# Patient Record
Sex: Female | Born: 1955
Health system: Southern US, Community
[De-identification: ages and names within clinical notes are randomized; demographics above are authoritative.]

## PROBLEM LIST (undated history)

## (undated) DIAGNOSIS — N200 Calculus of kidney: Secondary | ICD-10-CM

## (undated) DIAGNOSIS — I959 Hypotension, unspecified: Secondary | ICD-10-CM

## (undated) DIAGNOSIS — M5030 Other cervical disc degeneration, unspecified cervical region: Secondary | ICD-10-CM

## (undated) DIAGNOSIS — Z9889 Other specified postprocedural states: Secondary | ICD-10-CM

## (undated) DIAGNOSIS — Z87442 Personal history of urinary calculi: Secondary | ICD-10-CM

## (undated) DIAGNOSIS — Z803 Family history of malignant neoplasm of breast: Secondary | ICD-10-CM

## (undated) HISTORY — DX: Hypotension, unspecified: I95.9

## (undated) HISTORY — PX: CERVIX SURGERY: SHX593

## (undated) HISTORY — PX: FOOT SURGERY: SHX648

## (undated) HISTORY — DX: Other specified postprocedural states: Z98.890

## (undated) HISTORY — PX: OTHER SURGICAL HISTORY: SHX169

## (undated) HISTORY — DX: Family history of malignant neoplasm of breast: Z80.3

---

## 2006-02-01 ENCOUNTER — Ambulatory Visit: Payer: Self-pay | Admitting: Gastroenterology

## 2007-04-15 ENCOUNTER — Ambulatory Visit: Payer: Self-pay | Admitting: Sports Medicine

## 2009-12-21 ENCOUNTER — Ambulatory Visit: Payer: Self-pay | Admitting: Family Medicine

## 2011-02-21 HISTORY — PX: COLONOSCOPY: SHX174

## 2012-12-17 ENCOUNTER — Encounter: Payer: Self-pay | Admitting: Podiatry

## 2012-12-17 ENCOUNTER — Ambulatory Visit (INDEPENDENT_AMBULATORY_CARE_PROVIDER_SITE_OTHER): Payer: BC Managed Care – PPO

## 2012-12-17 ENCOUNTER — Ambulatory Visit (INDEPENDENT_AMBULATORY_CARE_PROVIDER_SITE_OTHER): Payer: BC Managed Care – PPO | Admitting: Podiatry

## 2012-12-17 VITALS — BP 128/66 | HR 77 | Resp 16 | Ht 65.0 in | Wt 150.0 lb

## 2012-12-17 DIAGNOSIS — R52 Pain, unspecified: Secondary | ICD-10-CM

## 2012-12-17 DIAGNOSIS — S92919A Unspecified fracture of unspecified toe(s), initial encounter for closed fracture: Secondary | ICD-10-CM

## 2012-12-17 NOTE — Progress Notes (Signed)
Subjective:     Patient ID: Olivia Richards, female   DOB: Jun 05, 1955, 57 y.o.   MRN: [id number]  Foot Pain   patient states that she hit her third toe of her right foot 2 weeks ago and is remaining very tender. She is concerned that she may have broke her toe   Review of Systems  All other systems reviewed and are negative.       Objective:   Physical Exam  Nursing note and vitals reviewed. Constitutional: She is oriented to person, place, and time. She appears well-nourished.  Cardiovascular: Intact distal pulses.   Musculoskeletal: Normal range of motion.  Neurological: She is oriented to person, place, and time.  Skin: Skin is warm.   patient has edema and pain in the third toe right foot with no proximal discomfort noted    Assessment:     Probable fracture third toe right foot    Plan:     H&P and x-ray review. Advised on open toed shoes with gradual return to shoes over the next 2 weeks and total recovery. We'll probably take 8-12 weeks reappoint as needed

## 2013-03-18 ENCOUNTER — Other Ambulatory Visit: Payer: Self-pay | Admitting: Podiatry

## 2014-06-18 ENCOUNTER — Encounter: Payer: Self-pay | Admitting: Podiatry

## 2014-06-18 ENCOUNTER — Ambulatory Visit (INDEPENDENT_AMBULATORY_CARE_PROVIDER_SITE_OTHER): Payer: Self-pay

## 2014-06-18 ENCOUNTER — Ambulatory Visit (INDEPENDENT_AMBULATORY_CARE_PROVIDER_SITE_OTHER): Payer: BC Managed Care – PPO | Admitting: Podiatry

## 2014-06-18 VITALS — Ht 65.5 in | Wt 150.0 lb

## 2014-06-18 DIAGNOSIS — M79671 Pain in right foot: Secondary | ICD-10-CM

## 2014-06-18 DIAGNOSIS — L03115 Cellulitis of right lower limb: Secondary | ICD-10-CM

## 2014-06-18 MED ORDER — DOXYCYCLINE HYCLATE 100 MG PO TABS: 100.0000 mg | ORAL_TABLET | Freq: Two times a day (BID) | ORAL | Status: DC

## 2014-06-22 NOTE — Progress Notes (Signed)
Patient ID: Olivia Richards, female   DOB: 1955-10-08, 59 y.o.   MRN: [id number]  Subjective: 59 year old female presents the office today with complaints of right foot pain. She states that she has swelling and redness of the inside aspect of her arch on the right foot. She states his been ongoing since Thursday. She states that she has pain to the area particularly with weightbearing and any pressure. She's been soaking in Epson salts. She does that she has a history of an abscess to this same foot. She denies stepping on any foreign objects denies any open sores. Denies any systemic complaints such as fevers, chills, nausea, vomiting. No acute changes since last appointment, and no other complaints at this time.   Objective: AAO x3, NAD DP/PT pulses palpable bilaterally, CRT less than 3 seconds Protective sensation intact with Simms Weinstein monofilament, vibratory sensation intact, Achilles tendon reflex intact Along the medial aspect of the arch of the right foot on the midfoot there is a localized area of edema and erythema. Upon palpation of this area there is tenderness. He does appear to be almost a cystlike structure deep to this area however denies small. There is no area of fluctuance or crepitus. No ascending cellulitis. No overlying ulceration. No areas of pinpoint bony tenderness or pain with vibratory sensation. MMT 5/5, ROM WNL. No edema, erythema, increase in warmth to bilateral lower extremities.  No open lesions or pre-ulcerative lesions.  No pain with calf compression, swelling, warmth, erythema  Assessment: 59 year old female with possible early infection versus cyst  Plan: -X-rays were obtained and reviewed the patient -All treatment options discussed with the patient including all alternatives, risks, complications.  -For now we'll treat for infection. Prescribed doxycycline. Recommend warm compresses. Monitor for any signs or symptoms of worsening infection and  directed to call the office and musician any occur or go to the ER. -Follow-up in 10 days. -Patient encouraged to call the office with any questions, concerns, change in symptoms.

## 2014-06-23 ENCOUNTER — Ambulatory Visit: Payer: Self-pay | Admitting: Podiatry

## 2014-06-25 ENCOUNTER — Ambulatory Visit: Payer: BC Managed Care – PPO | Admitting: Podiatry

## 2015-02-19 ENCOUNTER — Ambulatory Visit
Admission: RE | Admit: 2015-02-19 | Discharge: 2015-02-19 | Disposition: A | Discharge status: Home / Self Care | Payer: BC Managed Care – PPO | Source: Ambulatory Visit | Attending: Family Medicine | Admitting: Family Medicine

## 2015-02-19 ENCOUNTER — Ambulatory Visit (INDEPENDENT_AMBULATORY_CARE_PROVIDER_SITE_OTHER): Payer: BC Managed Care – PPO | Admitting: Family Medicine

## 2015-02-19 ENCOUNTER — Encounter: Payer: Self-pay | Admitting: Family Medicine

## 2015-02-19 VITALS — BP 140/80 | HR 80 | Ht 65.0 in | Wt 160.0 lb

## 2015-02-19 DIAGNOSIS — R200 Anesthesia of skin: Secondary | ICD-10-CM | POA: Diagnosis not present

## 2015-02-19 DIAGNOSIS — M5090 Cervical disc disorder, unspecified, unspecified cervical region: Secondary | ICD-10-CM

## 2015-02-19 DIAGNOSIS — R202 Paresthesia of skin: Secondary | ICD-10-CM | POA: Insufficient documentation

## 2015-02-19 NOTE — Progress Notes (Signed)
Name: Olivia Richards   MRN: [id number]    DOB: [birth year]   Date:02/19/2015       Progress Note  Subjective  Chief Complaint  Chief Complaint  Patient presents with  . Neck Pain    has deg disc disease- is having muscle spasms in back of neck. fingers and leg is going numb on L) side    Neck Pain  This is a chronic problem. The current episode started more than 1 year ago. The problem occurs daily. The problem has been waxing and waning. The pain is associated with nothing (hx of dance). The pain is present in the left side. The quality of the pain is described as aching. The pain is at a severity of 3/10. The pain is moderate. The symptoms are aggravated by position and stress. The pain is worse during the day. Associated symptoms include tingling. Pertinent negatives include no chest pain, fever, headaches, leg pain, numbness, paresis, visual change, weakness or weight loss. She has tried home exercises for the symptoms. The treatment provided no relief (presently).    No problem-specific assessment & plan notes found for this encounter.   Past Medical History  Diagnosis Date  . Low blood pressure     Past Surgical History  Procedure Laterality Date  . Foot surgery Left   . Cervix surgery  POLYP  . Colonoscopy  2013    Dr Gustavo Lah- every 5 yrs    No family history on file.  Social History   Social History  . Marital Status: Divorced    Spouse Name: N/A  . Number of Children: N/A  . Years of Education: N/A   Occupational History  . Not on file.   Social History Main Topics  . Smoking status: Never Smoker   . Smokeless tobacco: Never Used  . Alcohol Use: No  . Drug Use: No  . Sexual Activity: Not on file   Other Topics Concern  . Not on file   Social History Narrative    No Known Allergies   Review of Systems  Constitutional: Negative for fever, chills, weight loss and malaise/fatigue.  HENT: Negative for ear discharge, ear pain and sore throat.    Eyes: Negative for blurred vision.  Respiratory: Negative for cough, sputum production, shortness of breath and wheezing.   Cardiovascular: Negative for chest pain, palpitations and leg swelling.  Gastrointestinal: Negative for heartburn, nausea, abdominal pain, diarrhea, constipation, blood in stool and melena.  Genitourinary: Negative for dysuria, urgency, frequency and hematuria.  Musculoskeletal: Positive for neck pain. Negative for myalgias, back pain, joint pain and falls.  Skin: Negative for rash.  Neurological: Positive for tingling. Negative for dizziness, sensory change, focal weakness, weakness, numbness and headaches.  Endo/Heme/Allergies: Negative for environmental allergies and polydipsia. Does not bruise/bleed easily.  Psychiatric/Behavioral: Negative for depression and suicidal ideas. The patient is not nervous/anxious and does not have insomnia.      Objective  Filed Vitals:   02/19/15 1417  BP: 140/80  Pulse: 80  Height: 5\' 5"  (1.651 m)  Weight: 160 lb (72.576 kg)    Physical Exam  Constitutional: She is oriented to person, place, and time and well-developed, well-nourished, and in no distress. No distress.  HENT:  Head: Normocephalic and atraumatic.  Right Ear: External ear normal.  Left Ear: External ear normal.  Nose: Nose normal.  Mouth/Throat: Oropharynx is clear and moist.  Eyes: Conjunctivae and EOM are normal. Pupils are equal, round, and reactive to light. Right eye exhibits no  discharge. Left eye exhibits no discharge.  Neck: Normal range of motion. Neck supple. No JVD present. No thyromegaly present.  Cardiovascular: Normal rate, regular rhythm, normal heart sounds and intact distal pulses.  Exam reveals no gallop and no friction rub.   No murmur heard. Pulmonary/Chest: Effort normal and breath sounds normal.  Abdominal: Soft. Bowel sounds are normal. She exhibits no mass. There is no tenderness. There is no guarding.  Musculoskeletal: Normal range  of motion. She exhibits no edema.  Lymphadenopathy:    She has no cervical adenopathy.  Neurological: She is alert and oriented to person, place, and time. She has normal motor skills, normal sensation, normal strength, normal reflexes and intact cranial nerves.  Skin: Skin is warm and dry. She is not diaphoretic.  Psychiatric: Mood and affect normal.      Assessment & Plan  Problem List Items Addressed This Visit    None    Visit Diagnoses    Cervical disc disease    -  Primary    Relevant Orders    DG Cervical Spine Complete         Dr. Otilio Miu [facility]  02/19/2015

## 2015-02-19 NOTE — Patient Instructions (Signed)
Degenerative Disk Disease  Degenerative disk disease is a condition caused by the changes that occur in spinal disks as you grow older. Spinal disks are soft and compressible disks located between the bones of your spine (vertebrae). These disks act like shock absorbers. Degenerative disk disease can affect the whole spine. However, the neck and lower back are most commonly affected. Many changes can occur in the spinal disks with aging, such as:  · The spinal disks may dry and shrink.  · Small tears may occur in the tough, outer covering of the disk (annulus).  · The disk space may become smaller due to loss of water.  · Abnormal growths in the bone (spurs) may occur. This can put pressure on the nerve roots exiting the spinal canal, causing pain.  · The spinal canal may become narrowed.  RISK FACTORS   · Being overweight.  · Having a family history of degenerative disk disease.  · Smoking.  · There is increased risk if you are doing heavy lifting or have a sudden injury.  SIGNS AND SYMPTOMS   Symptoms vary from person to person and may include:  · Pain that varies in intensity. Some people have no pain, while others have severe pain. The location of the pain depends on the part of your backbone that is affected.  ¨ You will have neck or arm pain if a disk in the neck area is affected.  ¨ You will have pain in your back, buttocks, or legs if a disk in the lower back is affected.  · Pain that becomes worse while bending, reaching up, or with twisting movements.  · Pain that may start gradually and then get worse as time passes. It may also start after a major or minor injury.  · Numbness or tingling in the arms or legs.  DIAGNOSIS   Your health care provider will ask you about your symptoms and about activities or habits that may cause the pain. He or she may also ask about any injuries, diseases, or treatments you have had. Your health care provider will examine you to check for the range of movement that is  possible in the affected area, to check for strength in your extremities, and to check for sensation in the areas of the arms and legs supplied by different nerve roots. You may also have:   · An X-ray of the spine.  · Other imaging tests, such as MRI.  TREATMENT   Your health care provider will advise you on the best plan for treatment. Treatment may include:  · Medicines.  · Rehabilitation exercises.  HOME CARE INSTRUCTIONS   · Follow proper lifting and walking techniques as advised by your health care provider.  · Maintain good posture.  · Exercise regularly as advised by your health care provider.  · Perform relaxation exercises.  · Change your sitting, standing, and sleeping habits as advised by your health care provider.  · Change positions frequently.  · Lose weight or maintain a healthy weight as advised by your health care provider.  · Do not use any tobacco products, including cigarettes, chewing tobacco, or electronic cigarettes. If you need help quitting, ask your health care provider.  · Wear supportive footwear.  · Take medicines only as directed by your health care provider.  SEEK MEDICAL CARE IF:   · Your pain does not go away within 1-4 weeks.  · You have significant appetite or weight loss.  SEEK IMMEDIATE MEDICAL CARE IF:   ·   Your pain is severe.  · You notice weakness in your arms, hands, or legs.  · You begin to lose control of your bladder or bowel movements.  · You have fevers or night sweats.  MAKE SURE YOU:   · Understand these instructions.  · Will watch your condition.  · Will get help right away if you are not doing well or get worse.     This information is not intended to replace advice given to you by your health care provider. Make sure you discuss any questions you have with your health care provider.     Document Released: 12/04/2006 Document Revised: 02/27/2014 Document Reviewed: 06/10/2013  Elsevier Interactive Patient Education ©2016 [employer]

## 2015-02-21 DIAGNOSIS — Z9889 Other specified postprocedural states: Secondary | ICD-10-CM

## 2015-02-21 HISTORY — DX: Other specified postprocedural states: Z98.890

## 2015-02-23 ENCOUNTER — Other Ambulatory Visit: Payer: Self-pay

## 2015-02-23 DIAGNOSIS — M539 Dorsopathy, unspecified: Secondary | ICD-10-CM

## 2015-06-10 ENCOUNTER — Encounter: Payer: Self-pay | Admitting: Family Medicine

## 2015-06-10 ENCOUNTER — Ambulatory Visit (INDEPENDENT_AMBULATORY_CARE_PROVIDER_SITE_OTHER): Payer: BC Managed Care – PPO | Admitting: Family Medicine

## 2015-06-10 VITALS — BP 120/78 | HR 72 | Ht 65.0 in | Wt 172.0 lb

## 2015-06-10 DIAGNOSIS — N3090 Cystitis, unspecified without hematuria: Secondary | ICD-10-CM

## 2015-06-10 DIAGNOSIS — M533 Sacrococcygeal disorders, not elsewhere classified: Secondary | ICD-10-CM | POA: Diagnosis not present

## 2015-06-10 LAB — POCT URINALYSIS DIPSTICK
Bilirubin, UA: NEGATIVE
Blood, UA: NEGATIVE
Glucose, UA: NEGATIVE
KETONES UA: NEGATIVE
Leukocytes, UA: NEGATIVE
Nitrite, UA: NEGATIVE
PROTEIN UA: NEGATIVE
Spec Grav, UA: 1.02
Urobilinogen, UA: 0.2
pH, UA: 5

## 2015-06-10 MED ORDER — FLUCONAZOLE 150 MG PO TABS: 150.0000 mg | ORAL_TABLET | Freq: Once | ORAL | Status: DC

## 2015-06-10 MED ORDER — NITROFURANTOIN MONOHYD MACRO 100 MG PO CAPS: 100.0000 mg | ORAL_CAPSULE | Freq: Two times a day (BID) | ORAL | Status: DC

## 2015-06-10 NOTE — Progress Notes (Signed)
Name: Olivia Richards   MRN: [id number]    DOB: 1955/05/21   Date:06/10/2015       Progress Note  Subjective  Chief Complaint  Chief Complaint  Patient presents with  . Flank Pain    "hurting in Left kidney"- started Monday- years ago was kept on Macrobid due to wearing tights and sweating alot    Flank Pain This is a new problem. The current episode started in the past 7 days. The problem occurs daily. The problem has been waxing and waning since onset. The pain is present in the lumbar spine. The quality of the pain is described as aching. The pain does not radiate. The pain is mild. The pain is worse during the day. Pertinent negatives include no abdominal pain, bladder incontinence, bowel incontinence, chest pain, dysuria, fever, headaches, paresthesias, tingling or weight loss. The treatment provided no relief.    No problem-specific assessment & plan notes found for this encounter.   Past Medical History  Diagnosis Date  . Low blood pressure     Past Surgical History  Procedure Laterality Date  . Foot surgery Left   . Cervix surgery  POLYP  . Colonoscopy  2013    Dr Gustavo Lah- every 5 yrs  . Lithotripsy      History reviewed. No pertinent family history.  Social History   Social History  . Marital Status: Divorced    Spouse Name: N/A  . Number of Children: N/A  . Years of Education: N/A   Occupational History  . Not on file.   Social History Main Topics  . Smoking status: Never Smoker   . Smokeless tobacco: Never Used  . Alcohol Use: No  . Drug Use: No  . Sexual Activity: Not Currently   Other Topics Concern  . Not on file   Social History Narrative    No Known Allergies   Review of Systems  Constitutional: Negative for fever, chills, weight loss and malaise/fatigue.  HENT: Negative for ear discharge, ear pain and sore throat.   Eyes: Negative for blurred vision.  Respiratory: Negative for cough, sputum production, shortness of breath and  wheezing.   Cardiovascular: Negative for chest pain, palpitations and leg swelling.  Gastrointestinal: Negative for heartburn, nausea, abdominal pain, diarrhea, constipation, blood in stool, melena and bowel incontinence.  Genitourinary: Positive for flank pain. Negative for bladder incontinence, dysuria, urgency, frequency and hematuria.  Musculoskeletal: Negative for myalgias, back pain, joint pain and neck pain.  Skin: Negative for rash.  Neurological: Negative for dizziness, tingling, sensory change, focal weakness, headaches and paresthesias.  Endo/Heme/Allergies: Negative for environmental allergies and polydipsia. Does not bruise/bleed easily.  Psychiatric/Behavioral: Negative for depression and suicidal ideas. The patient is not nervous/anxious and does not have insomnia.      Objective  Filed Vitals:   06/10/15 0810  BP: 120/78  Pulse: 72  Height: 5\' 5"  (1.651 m)  Weight: 172 lb (78.019 kg)    Physical Exam  Constitutional: She is well-developed, well-nourished, and in no distress. No distress.  HENT:  Head: Normocephalic and atraumatic.  Right Ear: External ear normal.  Left Ear: External ear normal.  Nose: Nose normal.  Mouth/Throat: Oropharynx is clear and moist.  Eyes: Conjunctivae and EOM are normal. Pupils are equal, round, and reactive to light. Right eye exhibits no discharge. Left eye exhibits no discharge.  Neck: Normal range of motion. Neck supple. No JVD present. No thyromegaly present.  Cardiovascular: Normal rate, regular rhythm, normal heart sounds and intact distal  pulses.  Exam reveals no gallop and no friction rub.   No murmur heard. Pulmonary/Chest: Effort normal and breath sounds normal.  Abdominal: Soft. Bowel sounds are normal. She exhibits no mass. There is no tenderness. There is no guarding.  Musculoskeletal: Normal range of motion. She exhibits no edema.  Lymphadenopathy:    She has no cervical adenopathy.  Neurological: She is alert. She has  normal motor skills, normal sensation, normal strength and normal reflexes.  Skin: Skin is warm and dry. She is not diaphoretic.  Psychiatric: Mood and affect normal.  Nursing note and vitals reviewed.     Assessment & Plan  Problem List Items Addressed This Visit    None    Visit Diagnoses    Cystitis    -  Primary    Relevant Medications    nitrofurantoin, macrocrystal-monohydrate, (MACROBID) 100 MG capsule    fluconazole (DIFLUCAN) 150 MG tablet    Other Relevant Orders    POCT Urinalysis Dipstick (Completed)    Arthralgia, sacroiliac        resume NSAID         Dr. [facility]  06/10/2015

## 2015-12-21 ENCOUNTER — Encounter: Payer: Self-pay | Admitting: Family Medicine

## 2015-12-21 ENCOUNTER — Ambulatory Visit
Admission: RE | Admit: 2015-12-21 | Discharge: 2015-12-21 | Disposition: A | Discharge status: Home / Self Care | Payer: BC Managed Care – PPO | Source: Ambulatory Visit | Attending: Family Medicine | Admitting: Family Medicine

## 2015-12-21 ENCOUNTER — Ambulatory Visit (INDEPENDENT_AMBULATORY_CARE_PROVIDER_SITE_OTHER): Payer: BC Managed Care – PPO | Admitting: Family Medicine

## 2015-12-21 ENCOUNTER — Other Ambulatory Visit
Admission: RE | Admit: 2015-12-21 | Discharge: 2015-12-21 | Disposition: A | Discharge status: Home / Self Care | Payer: BC Managed Care – PPO | Source: Ambulatory Visit | Attending: Family Medicine | Admitting: Family Medicine

## 2015-12-21 VITALS — BP 118/70 | HR 78 | Temp 100.0°F | Ht 65.0 in | Wt 171.0 lb

## 2015-12-21 DIAGNOSIS — R509 Fever, unspecified: Secondary | ICD-10-CM

## 2015-12-21 DIAGNOSIS — J189 Pneumonia, unspecified organism: Secondary | ICD-10-CM | POA: Diagnosis not present

## 2015-12-21 LAB — CBC
HCT: 41.1 % (ref 35.0–47.0)
HEMOGLOBIN: 13.7 g/dL (ref 12.0–16.0)
MCH: 26.7 pg (ref 26.0–34.0)
MCHC: 33.2 g/dL (ref 32.0–36.0)
MCV: 80.5 fL (ref 80.0–100.0)
Platelets: 219 10*3/uL (ref 150–440)
RBC: 5.11 MIL/uL (ref 3.80–5.20)
RDW: 14 % (ref 11.5–14.5)
WBC: 9 10*3/uL (ref 3.6–11.0)

## 2015-12-21 LAB — POCT INFLUENZA A/B
Influenza A, POC: NEGATIVE
Influenza B, POC: NEGATIVE

## 2015-12-21 MED ORDER — LEVOFLOXACIN 500 MG PO TABS: 500.0000 mg | ORAL_TABLET | Freq: Every day | ORAL | 0 refills | Status: DC

## 2015-12-21 MED ORDER — GUAIFENESIN-CODEINE 100-10 MG/5ML PO SYRP: 5.0000 mL | ORAL_SOLUTION | Freq: Three times a day (TID) | ORAL | 0 refills | Status: DC | PRN

## 2015-12-21 NOTE — Progress Notes (Signed)
Name: Olivia Richards   MRN: [id number]    DOB: 13-Sep-1955   Date:12/21/2015       Progress Note  Subjective  Chief Complaint  Chief Complaint  Patient presents with  . Generalized Body Aches    "want to rule out the flu".     Fever   This is a new problem. The current episode started yesterday. The problem occurs intermittently. The problem has been waxing and waning. The maximum temperature noted was 100 to 100.9 F. The temperature was taken using an oral thermometer. Associated symptoms include coughing, headaches and muscle aches. Pertinent negatives include no abdominal pain, chest pain, congestion, diarrhea, ear pain, nausea, rash, sleepiness, sore throat, urinary pain, vomiting or wheezing. She has tried acetaminophen for the symptoms. The treatment provided no relief.  Risk factors: sick contacts   Risk factors: no recent sickness and no recent travel   Cough  This is a new problem. The current episode started in the past 7 days. The problem has been gradually worsening. The cough is non-productive. Associated symptoms include chills, a fever, headaches, myalgias and rhinorrhea. Pertinent negatives include no chest pain, ear congestion, ear pain, heartburn, hemoptysis, nasal congestion, postnasal drip, rash, sore throat, shortness of breath, sweats, weight loss or wheezing. She has tried nothing for the symptoms. There is no history of environmental allergies.    No problem-specific Assessment & Plan notes found for this encounter.   Past Medical History:  Diagnosis Date  . Low blood pressure     Past Surgical History:  Procedure Laterality Date  . CERVIX SURGERY  POLYP  . COLONOSCOPY  2013   Dr Gustavo Lah- every 5 yrs  . FOOT SURGERY Left   . lithotripsy      History reviewed. No pertinent family history.  Social History   Social History  . Marital status: Divorced    Spouse name: N/A  . Number of children: N/A  . Years of education: N/A   Occupational  History  . Not on file.   Social History Main Topics  . Smoking status: Never Smoker  . Smokeless tobacco: Never Used  . Alcohol use No  . Drug use: No  . Sexual activity: Not Currently   Other Topics Concern  . Not on file   Social History Narrative  . No narrative on file    No Known Allergies   Review of Systems  Constitutional: Positive for chills and fever. Negative for malaise/fatigue and weight loss.  HENT: Positive for rhinorrhea. Negative for congestion, ear discharge, ear pain, postnasal drip and sore throat.   Eyes: Negative for blurred vision.  Respiratory: Positive for cough. Negative for hemoptysis, sputum production, shortness of breath and wheezing.   Cardiovascular: Negative for chest pain, palpitations and leg swelling.  Gastrointestinal: Negative for abdominal pain, blood in stool, constipation, diarrhea, heartburn, melena, nausea and vomiting.  Genitourinary: Negative for dysuria, frequency, hematuria and urgency.  Musculoskeletal: Positive for myalgias. Negative for back pain, joint pain and neck pain.  Skin: Negative for rash.  Neurological: Positive for headaches. Negative for dizziness, tingling, sensory change and focal weakness.  Endo/Heme/Allergies: Negative for environmental allergies and polydipsia. Does not bruise/bleed easily.  Psychiatric/Behavioral: Negative for depression and suicidal ideas. The patient is not nervous/anxious and does not have insomnia.      Objective  Vitals:   12/21/15 1539  BP: 118/70  Pulse: 78  Temp: 100 F (37.8 C)  SpO2: 98%  Weight: 171 lb (77.6 kg)  Height: 5\' 5"  (  1.651 m)    Physical Exam  Constitutional: She is well-developed, well-nourished, and in no distress. No distress.  HENT:  Head: Normocephalic and atraumatic.  Right Ear: External ear normal.  Left Ear: External ear normal.  Nose: Nose normal.  Mouth/Throat: Oropharynx is clear and moist.  Eyes: Conjunctivae and EOM are normal. Pupils are  equal, round, and reactive to light. Right eye exhibits no discharge. Left eye exhibits no discharge.  Neck: Normal range of motion. Neck supple. No JVD present. No thyromegaly present.  Cardiovascular: Normal rate, regular rhythm, normal heart sounds and intact distal pulses.  Exam reveals no gallop and no friction rub.   No murmur heard. Pulmonary/Chest: Effort normal and breath sounds normal. She has no wheezes. She has no rales.  Abdominal: Soft. Bowel sounds are normal. She exhibits no mass. There is no tenderness. There is no guarding.  Musculoskeletal: Normal range of motion. She exhibits no edema.  Lymphadenopathy:    She has no cervical adenopathy.  Neurological: She is alert. She has normal reflexes.  Skin: Skin is warm and dry. She is not diaphoretic.  Psychiatric: Mood and affect normal.  Nursing note and vitals reviewed.     Assessment & Plan  Problem List Items Addressed This Visit    None    Visit Diagnoses    Pneumonia due to infectious organism, unspecified laterality, unspecified part of lung    -  Primary   cbc   Relevant Medications   levofloxacin (LEVAQUIN) 500 MG tablet   guaiFENesin-codeine (ROBITUSSIN AC) 100-10 MG/5ML syrup   Other Relevant Orders   DG Chest 2 View   POCT Influenza A/B (Completed)   Fever, unspecified fever cause       Relevant Orders   POCT Influenza A/B (Completed)        Dr. [facility]  12/21/15

## 2015-12-24 ENCOUNTER — Other Ambulatory Visit: Payer: Self-pay

## 2015-12-24 DIAGNOSIS — J189 Pneumonia, unspecified organism: Secondary | ICD-10-CM

## 2015-12-24 MED ORDER — LEVOFLOXACIN 500 MG PO TABS: 500.0000 mg | ORAL_TABLET | Freq: Every day | ORAL | 0 refills | Status: DC

## 2016-01-17 ENCOUNTER — Other Ambulatory Visit: Payer: Self-pay

## 2017-02-04 ENCOUNTER — Other Ambulatory Visit: Payer: Self-pay

## 2017-02-04 ENCOUNTER — Ambulatory Visit
Admission: EM | Admit: 2017-02-04 | Discharge: 2017-02-04 | Disposition: A | Discharge status: Home / Self Care | Payer: BC Managed Care – PPO | Attending: Family Medicine | Admitting: Family Medicine

## 2017-02-04 DIAGNOSIS — K219 Gastro-esophageal reflux disease without esophagitis: Secondary | ICD-10-CM | POA: Diagnosis not present

## 2017-02-04 DIAGNOSIS — R0789 Other chest pain: Secondary | ICD-10-CM | POA: Diagnosis not present

## 2017-02-04 DIAGNOSIS — M5010 Cervical disc disorder with radiculopathy, unspecified cervical region: Secondary | ICD-10-CM | POA: Diagnosis not present

## 2017-02-04 DIAGNOSIS — M4722 Other spondylosis with radiculopathy, cervical region: Secondary | ICD-10-CM | POA: Insufficient documentation

## 2017-02-04 DIAGNOSIS — Z87442 Personal history of urinary calculi: Secondary | ICD-10-CM | POA: Insufficient documentation

## 2017-02-04 DIAGNOSIS — R202 Paresthesia of skin: Secondary | ICD-10-CM | POA: Insufficient documentation

## 2017-02-04 DIAGNOSIS — Z79899 Other long term (current) drug therapy: Secondary | ICD-10-CM | POA: Diagnosis not present

## 2017-02-04 HISTORY — DX: Other cervical disc degeneration, unspecified cervical region: M50.30

## 2017-02-04 HISTORY — DX: Calculus of kidney: N20.0

## 2017-02-04 NOTE — ED Triage Notes (Signed)
Pt with 2 days of left arm numbess, left left numbness intermittently. Thoracic spine sharp pain and left scapular pain. Chest tightness and has "lots of belching".

## 2017-02-04 NOTE — ED Provider Notes (Signed)
[facility]    CSN: [id number] Arrival date & time: 02/04/17  1322     History   Chief Complaint Chief Complaint  Patient presents with  . Arm Problem    HPI Olivia Richards is a 61 y.o. female.   61 yo female with a c/o 2 days of intermittent left arm numbness and pain as well as pain to left upper back and shoulder blade area. Patient denies any falls or other traumatic injury. Patient has a h/o of similar symptoms in the past and had cervical spine x-rays 2 years ago that showed multilevel degenerative disc disease and bone spurs of the cervical spine. Denies any weakness, vision changes, headaches, speech or swallowing problems.   Patient also complains of vague lower chest/epigastric tightness associated with belching, acid reflux and indigestion. Denies any chest pressure or pain. States she has been taking Aleve recently for her neck and back pains.    The history is provided by the patient.    Past Medical History:  Diagnosis Date  . DDD (degenerative disc disease), cervical   . Kidney stones   . Low blood pressure     There are no active problems to display for this patient.   Past Surgical History:  Procedure Laterality Date  . CERVIX SURGERY  POLYP  . COLONOSCOPY  2013   Dr Gustavo Lah- every 5 yrs  . FOOT SURGERY Left   . lithotripsy      OB History    No data available       Home Medications    Prior to Admission medications   Medication Sig Start Date End Date Taking? Authorizing Provider  cholecalciferol (VITAMIN D) 1000 units tablet Take 1,000 Units by mouth daily.   Yes [provider]  ammonium lactate (LAC-HYDRIN) 12 % lotion APPLY TO SKIN DAILY Patient not taking: Reported on 12/21/2015 03/18/13   Wallene Huh, DPM  fluconazole (DIFLUCAN) 150 MG tablet Take 1 tablet (150 mg total) by mouth once. Patient not taking: Reported on 12/21/2015 06/10/15   Juline Patch, MD  guaiFENesin-codeine [facility]) 100-10  MG/5ML syrup Take 5 mLs by mouth 3 (three) times daily as needed for cough. 12/21/15   Juline Patch, MD  levofloxacin (LEVAQUIN) 500 MG tablet Take 1 tablet (500 mg total) by mouth daily. 12/24/15   Juline Patch, MD  nitrofurantoin, macrocrystal-monohydrate, (MACROBID) 100 MG capsule Take 1 capsule (100 mg total) by mouth 2 (two) times daily. Patient not taking: Reported on 12/21/2015 06/10/15   Juline Patch, MD    Family History Family History  Problem Relation Age of Onset  . Cancer Mother   . Heart disease Father     Social History Social History   Tobacco Use  . Smoking status: Never Smoker  . Smokeless tobacco: Never Used  Substance Use Topics  . Alcohol use: No  . Drug use: No     Allergies   Levaquin [levofloxacin in d5w]   Review of Systems Review of Systems   Physical Exam Triage Vital Signs ED Triage Vitals  Enc Vitals Group     BP 02/04/17 1339 (!) 158/77     Pulse Rate 02/04/17 1339 79     Resp 02/04/17 1339 16     Temp 02/04/17 1339 97.8 F (36.6 C)     Temp Source 02/04/17 1339 Oral     SpO2 02/04/17 1339 99 %     Weight 02/04/17 1339 165 lb (74.8 kg)  Height 02/04/17 1339 5' 5.5" (1.664 m)     Head Circumference --      Peak Flow --      Pain Score 02/04/17 1340 3     Pain Loc --      Pain Edu? --      Excl. in [place]? --    No data found.  Updated Vital Signs BP (!) 158/77 (BP Location: Right Arm)   Pulse 79   Temp 97.8 F (36.6 C) (Oral)   Resp 16   Ht 5' 5.5" (1.664 m)   Wt 165 lb (74.8 kg)   SpO2 99%   BMI 27.04 kg/m   Visual Acuity Right Eye Distance:   Left Eye Distance:   Bilateral Distance:    Right Eye Near:   Left Eye Near:    Bilateral Near:     Physical Exam  Constitutional: She is oriented to person, place, and time. She appears well-developed and well-nourished. No distress.  HENT:  Head: Normocephalic and atraumatic.  Right Ear: Tympanic membrane, external ear and ear canal normal.  Left Ear: Tympanic  membrane, external ear and ear canal normal.  Mouth/Throat: Mucous membranes are normal.  Eyes: EOM are normal. Pupils are equal, round, and reactive to light.  Neck: Normal range of motion. Neck supple. No JVD present. No tracheal deviation present. No thyromegaly present.  Cardiovascular: Normal rate, regular rhythm, normal heart sounds and intact distal pulses.  No murmur heard. Pulmonary/Chest: Effort normal and breath sounds normal. No stridor. No respiratory distress. She has no wheezes. She has no rales. She exhibits no tenderness.  Abdominal: Soft. Bowel sounds are normal. She exhibits no distension and no mass. There is no tenderness. There is no rebound and no guarding.  Musculoskeletal: She exhibits no edema.       Cervical back: She exhibits tenderness (over the left trapezius ) and spasm.  Lymphadenopathy:    She has no cervical adenopathy.  Neurological: She is alert and oriented to person, place, and time. She displays normal reflexes. No cranial nerve deficit or sensory deficit. She exhibits normal muscle tone. Coordination normal.  Skin: Skin is warm and dry. No rash noted. She is not diaphoretic. No erythema. No pallor.  Vitals reviewed.    UC Treatments / Results  Labs (all labs ordered are listed, but only abnormal results are displayed) Labs Reviewed - No data to display  EKG  EKG Interpretation None       Radiology No results found.  Procedures ED EKG Date/Time: 02/04/2017 2:19 PM Performed by: Norval Gable, MD Authorized by: Norval Gable, MD   ECG reviewed by ED Physician in the absence of a cardiologist: yes   Previous ECG:    Previous ECG:  Unavailable Interpretation:    Interpretation: normal   Rate:    ECG rate assessment: normal   Rhythm:    Rhythm: sinus rhythm   Ectopy:    Ectopy: none   QRS:    QRS axis:  Normal Conduction:    Conduction: normal   ST segments:    ST segments:  Normal T waves:    T waves: normal      (including critical care time)  Medications Ordered in UC Medications - No data to display   Initial Impression / Assessment and Plan / UC Course  I have reviewed the triage vital signs and the nursing notes.  Pertinent labs & imaging results that were available during my care of the patient were reviewed by  me and considered in my medical decision making (see chart for details).       Final Clinical Impressions(s) / UC Diagnoses   Final diagnoses:  Gastroesophageal reflux disease, esophagitis presence not specified  Arm paresthesia, left  Osteoarthritis of spine with radiculopathy, cervical region    ED Discharge Orders    None     1. ekg results and diagnoses reviewed with patient 2. Recommend supportive treatment with otc prilosec or other PPI, stop NSAIDS for now, tylenol extra strength prn for pain, heat to area and stretching 3. Follow up with PCP for possible further testing or imaging 4. Follow-up prn if symptoms worsen or don't improve  Controlled Substance Prescriptions [place] Controlled Substance Registry consulted? Not Applicable   Norval Gable, MD 02/04/17 1426

## 2017-02-04 NOTE — Discharge Instructions (Signed)
Over the counter Prilosec once daily Tylenol extra strength as needed\ Heat, massage Follow up with PCP for further testing

## 2018-01-24 ENCOUNTER — Ambulatory Visit: Payer: Self-pay | Admitting: Certified Nurse Midwife

## 2018-01-25 ENCOUNTER — Other Ambulatory Visit (HOSPITAL_COMMUNITY)
Admission: RE | Admit: 2018-01-25 | Discharge: 2018-01-25 | Disposition: A | Discharge status: Home / Self Care | Payer: BC Managed Care – PPO | Source: Ambulatory Visit | Attending: Maternal Newborn | Admitting: Maternal Newborn

## 2018-01-25 ENCOUNTER — Encounter: Payer: Self-pay | Admitting: Maternal Newborn

## 2018-01-25 ENCOUNTER — Ambulatory Visit (INDEPENDENT_AMBULATORY_CARE_PROVIDER_SITE_OTHER): Payer: BC Managed Care – PPO | Admitting: Maternal Newborn

## 2018-01-25 VITALS — BP 148/84 | HR 66 | Ht 65.5 in | Wt 170.0 lb

## 2018-01-25 DIAGNOSIS — Z124 Encounter for screening for malignant neoplasm of cervix: Secondary | ICD-10-CM

## 2018-01-25 DIAGNOSIS — Z01419 Encounter for gynecological examination (general) (routine) without abnormal findings: Secondary | ICD-10-CM | POA: Diagnosis not present

## 2018-01-25 DIAGNOSIS — Z8739 Personal history of other diseases of the musculoskeletal system and connective tissue: Secondary | ICD-10-CM

## 2018-01-25 DIAGNOSIS — Z13220 Encounter for screening for lipoid disorders: Secondary | ICD-10-CM

## 2018-01-25 NOTE — Progress Notes (Addendum)
Gynecology Annual Exam  PCP: Juline Patch, MD  Chief Complaint:  Chief Complaint  Patient presents with  . Gynecologic Exam    NP    History of Present Illness:Patient is a 62 y.o. [id number] presenting for an annual exam. The patient has no complaints today.   LMP: No LMP recorded. Patient is postmenopausal.  The patient is not currently sexually active. The patient does perform self breast exams.  There is notable family history of breast cancer in her mother at age 78. There is no other history of breast or ovarian cancer in her family.   The patient wears seatbelts: yes.   The patient has regular exercise: yes.    The patient denies current symptoms of depression.     Review of Systems  Constitutional: Negative.   HENT: Negative.   Eyes: Negative.   Respiratory: Positive for cough. Negative for shortness of breath and wheezing.   Cardiovascular: Negative for chest pain and palpitations.  Gastrointestinal: Negative.   Genitourinary: Negative.   Musculoskeletal: Negative.   Skin: Negative.   Neurological: Negative.   Endo/Heme/Allergies: Positive for environmental allergies.  Psychiatric/Behavioral: Negative.   All other systems reviewed and are negative.   Past Medical History:  Past Medical History:  Diagnosis Date  . DDD (degenerative disc disease), cervical   . H/O colonoscopy 2017  . Kidney stones   . Low blood pressure     Past Surgical History:  Past Surgical History:  Procedure Laterality Date  . CERVIX SURGERY  POLYP  . COLONOSCOPY  2013   Dr Gustavo Lah- every 5 yrs  . FOOT SURGERY Left   . lithotripsy      Gynecologic History:  No LMP recorded. Patient is postmenopausal. Last Pap: 11/27/2014. Results were: NILM and HPV Negative Last mammogram: 12/13/2017.  Results were: BI-RAD II Obstetric History: [id number]  Family History:  Family History  Problem Relation Age of Onset  . Breast cancer Mother   . Heart disease Father     Social  History:  Social History   Socioeconomic History  . Marital status: Divorced    Spouse name: Not on file  . Number of children: Not on file  . Years of education: Not on file  . Highest education level: Not on file  Occupational History  . Not on file  Social Needs  . Financial resource strain: Not on file  . Food insecurity:    Worry: Not on file    Inability: Not on file  . Transportation needs:    Medical: Not on file    Non-medical: Not on file  Tobacco Use  . Smoking status: Never Smoker  . Smokeless tobacco: Never Used  Substance and Sexual Activity  . Alcohol use: No  . Drug use: No  . Sexual activity: Not Currently    Birth control/protection: Post-menopausal  Lifestyle  . Physical activity:    Days per week: Not on file    Minutes per session: Not on file  . Stress: Not on file  Relationships  . Social connections:    Talks on phone: Not on file    Gets together: Not on file    Attends religious service: Not on file    Active member of club or organization: Not on file    Attends meetings of clubs or organizations: Not on file    Relationship status: Not on file  . Intimate partner violence:    Fear of current or ex partner: Not on file  Emotionally abused: Not on file    Physically abused: Not on file    Forced sexual activity: Not on file  Other Topics Concern  . Not on file  Social History Narrative  . Not on file    Allergies:  Allergies  Allergen Reactions  . Onion Other (See Comments)    Diarrhea and vomiting  . Levaquin [Levofloxacin In D5w] Itching    Medications: Prior to Admission medications   Medication Sig Start Date End Date Taking? Authorizing Provider  ammonium lactate (LAC-HYDRIN) 12 % lotion APPLY TO SKIN DAILY 03/18/13  Yes Regal, Tamala Fothergill, DPM  cholecalciferol (VITAMIN D) 1000 units tablet Take 1,000 Units by mouth daily.   Yes [provider]    Physical Exam Vitals: Blood pressure (!) 150/82, pulse 66, height  5' 5.5" (1.664 m), weight 170 lb (77.1 kg).  General: NAD HEENT: normocephalic, anicteric Thyroid: no enlargement, no palpable nodules Pulmonary: No increased work of breathing, CTAB Cardiovascular: RRR, no murmurs, rubs, or gallops Breast: Breasts symmetrical, no tenderness, no palpable nodules or masses, no skin or nipple retraction present, no nipple discharge.  No axillary or supraclavicular lymphadenopathy. Abdomen: Soft, non-tender, non-distended.  Umbilicus without lesions.  No hepatomegaly, splenomegaly or masses palpable. No evidence of hernia  Genitourinary:  External: Normal external female genitalia.  Normal urethral  meatus, normal Bartholin's and Skene's glands.    Vagina: Normal vaginal mucosa, no evidence of prolapse.    Cervix: Grossly normal in appearance, no bleeding  Uterus: Non-enlarged, mobile, normal contour.  No CMT  Adnexa: ovaries non-enlarged, no adnexal masses  Rectal: deferred  Lymphatic: no evidence of inguinal lymphadenopathy Extremities: no edema, erythema, or tenderness Neurologic: Grossly intact Psychiatric: mood appropriate, affect full   Assessment: 62 y.o. G2P2002 annual exam.  Plan: Problem List Items Addressed This Visit    None    Visit Diagnoses    Women's annual routine gynecological examination    -  Primary   History of osteopenia       Relevant Orders   Vitamin D (25 hydroxy) (Completed)   Screening for cholesterol level       Relevant Orders   Lipid panel (Completed)   Pap smear for cervical cancer screening       Relevant Orders   Cytology - PAP      1) Mammogram - recommend yearly screening mammogram.  Mammogram is up to date.  2) STI screening was offered and declined.  3) ASCCP guidelines and rationale discussed.  Patient opts for yearly screening interval.  4) Osteoporosis  - per USPTF routine screening DEXA at age 87 Has had one prior scan showing osteopenia a few years ago. She is supplementing with calcium/Vitamin  D.  5) Routine healthcare maintenance including cholesterol, diabetes screening: lipid panel and Vitamin D ordered today.  6) Colonoscopy last done 09/2016, repeat in 5 years.  7) We discussed Myriad genetic screening for cancer due to her family history, and she is considering this option.  8) Follow up in 1 year for annual exam.  Avel Sensor, CNM 01/25/2018

## 2018-01-26 LAB — LIPID PANEL
CHOLESTEROL TOTAL: 214 mg/dL — AB (ref 100–199)
Chol/HDL Ratio: 3.6 ratio (ref 0.0–4.4)
HDL: 59 mg/dL (ref >39)
LDL Calculated: 136 mg/dL — ABNORMAL HIGH (ref 0–99)
TRIGLYCERIDES: 96 mg/dL (ref 0–149)
VLDL Cholesterol Cal: 19 mg/dL (ref 5–40)

## 2018-01-26 LAB — VITAMIN D 25 HYDROXY (VIT D DEFICIENCY, FRACTURES): Vit D, 25-Hydroxy: 33 ng/mL (ref 30.0–100.0)

## 2018-01-29 ENCOUNTER — Other Ambulatory Visit: Payer: Self-pay

## 2018-01-29 LAB — CYTOLOGY - PAP
Diagnosis: NEGATIVE
HPV (WINDOPATH): NOT DETECTED

## 2018-01-31 ENCOUNTER — Ambulatory Visit: Payer: Self-pay | Admitting: Maternal Newborn

## 2018-05-16 ENCOUNTER — Telehealth: Payer: Self-pay

## 2018-05-16 NOTE — Telephone Encounter (Signed)
Pt called in stating she had bad heartburn for a couple of days, no chest pain. However, she was experiencing some pressure. She was instructed to got to the ER for further eval as we cannot just say that it is heartburn. She was given an example of a pt that came in, EKG was normal, but left and had a heart attack. She stated "I'll just take my chances."

## 2018-05-20 ENCOUNTER — Telehealth: Payer: BC Managed Care – PPO | Admitting: Nurse Practitioner

## 2018-05-20 DIAGNOSIS — N3000 Acute cystitis without hematuria: Secondary | ICD-10-CM | POA: Diagnosis not present

## 2018-05-20 MED ORDER — CEPHALEXIN 500 MG PO CAPS: 500.0000 mg | ORAL_CAPSULE | Freq: Two times a day (BID) | ORAL | 0 refills | Status: DC

## 2018-05-20 NOTE — Progress Notes (Signed)

## 2018-06-05 ENCOUNTER — Telehealth: Payer: Self-pay

## 2018-06-05 NOTE — Telephone Encounter (Signed)
Renee from [facility] called for additional information as ins denied claim for Vit D.  Dx code Z23.419 given to [facility].

## 2018-06-12 ENCOUNTER — Telehealth: Payer: BC Managed Care – PPO | Admitting: Family

## 2018-06-12 ENCOUNTER — Telehealth: Payer: Self-pay | Admitting: Maternal Newborn

## 2018-06-12 ENCOUNTER — Other Ambulatory Visit: Payer: Self-pay

## 2018-06-12 ENCOUNTER — Encounter: Payer: Self-pay | Admitting: Obstetrics & Gynecology

## 2018-06-12 ENCOUNTER — Ambulatory Visit (INDEPENDENT_AMBULATORY_CARE_PROVIDER_SITE_OTHER): Payer: BC Managed Care – PPO | Admitting: Obstetrics & Gynecology

## 2018-06-12 VITALS — BP 142/82 | Ht 66.0 in | Wt 175.0 lb

## 2018-06-12 DIAGNOSIS — B3731 Acute candidiasis of vulva and vagina: Secondary | ICD-10-CM | POA: Insufficient documentation

## 2018-06-12 DIAGNOSIS — R300 Dysuria: Secondary | ICD-10-CM | POA: Insufficient documentation

## 2018-06-12 DIAGNOSIS — B373 Candidiasis of vulva and vagina: Secondary | ICD-10-CM | POA: Diagnosis not present

## 2018-06-12 DIAGNOSIS — N390 Urinary tract infection, site not specified: Secondary | ICD-10-CM

## 2018-06-12 LAB — POCT URINALYSIS DIPSTICK
Bilirubin, UA: NEGATIVE
Glucose, UA: NEGATIVE
Ketones, UA: NEGATIVE
Leukocytes, UA: NEGATIVE
Nitrite, UA: NEGATIVE
Protein, UA: NEGATIVE
Spec Grav, UA: 1.01 (ref 1.010–1.025)
Urobilinogen, UA: 1 E.U./dL
pH, UA: 5 (ref 5.0–8.0)

## 2018-06-12 MED ORDER — CLOTRIMAZOLE-BETAMETHASONE 1-0.05 % EX CREA: 1.0000 "application " | TOPICAL_CREAM | Freq: Two times a day (BID) | CUTANEOUS | 0 refills | Status: DC

## 2018-06-12 MED ORDER — NITROFURANTOIN MONOHYD MACRO 100 MG PO CAPS
100.0000 mg | ORAL_CAPSULE | Freq: Two times a day (BID) | ORAL | 0 refills | Status: DC
Start: 2018-06-12 — End: 2018-11-20

## 2018-06-12 NOTE — Telephone Encounter (Signed)
Patient is schedule at 3:00 with [facility] 06/12/18

## 2018-06-12 NOTE — Telephone Encounter (Signed)
Work in visit

## 2018-06-12 NOTE — Progress Notes (Signed)
Based on what you shared with me, I feel your condition warrants further evaluation and I recommend that you be seen for a face to face office visit.     NOTE: If you entered your credit card information for this eVisit, you will not be charged. You may see a "hold" on your card for the $35 but that hold will drop off and you will not have a charge processed.  If you are having a true medical emergency please call 911.  If you need an urgent face to face visit, [facility] has four urgent care centers for your convenience.    PLEASE NOTE: THE [facility] LOCATIONS AND URGENT CARE CLINICS DO NOT HAVE THE TESTING FOR CORONAVIRUS COVID19 AVAILABLE.  IF YOU FEEL YOU NEED THIS TEST YOU MUST GO TO A TRIAGE LOCATION AT ONE OF THE HOSPITAL EMERGENCY DEPARTMENTS   [email or web address] to reserve your spot online an avoid wait times  [facility] [address], [address] [place], [place] [address] Modified hours of operation: Monday-Friday, 10 AM to 6 PM  Saturday & Sunday 10 AM to 4 PM *Across the street from [employer]  [facility] (New Address!) [address], [address] [place], [place] [address] *Just off [address], across the road from [employer]* Modified hours of operation: Monday-Friday, 10 AM to 5 PM  Closed Saturday & Sunday   The following sites will take your insurance:  . [facility]  [phone] Get Driving Directions Find a Provider at this Location  [address] [place], [place] [address] . 10 am to 8 pm Monday-Friday . 12 pm to 8 pm Saturday-Sunday   . [facility]  [phone] Get Driving Directions Find a Provider at this Location  [address], [address] [place], [place] [address] . 8 am to 8 pm Monday-Friday . 9 am to 6 pm Saturday . 11 am to 6 pm Sunday   .  [facility]gent [facility] at [facility]  [phone] Get Driving Directions  [address] [place], [place] [address] . 8 am to 8 pm Monday-Friday . 8 am to 4 pm Saturday-Sunday   Your e-visit answers were reviewed by a board certified advanced clinical practitioner to complete your personal care plan.  Thank you for using e-Visits. 

## 2018-06-12 NOTE — Progress Notes (Signed)
HPI:      [name] is a 63 y.o. G2P2002 who LMP was No LMP recorded. Patient is postmenopausal., presents today for a problem visit.   Urinary Tract Infection: Patient complains of burning with urination and dysuria . She has had symptoms for 1 day. Patient also complains of back pain and this is mostly left sided. Patient denies fever. Patient does have a history of recurrent UTI.  Patient does not have a history of pyelonephritis. 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 Recent UTI in March 2020 dx by eVisit empirically after sweating activity, improved w Keflex.  Also has vulvar rash and itching along panty line when active and sweating as well.  No vag d/c or odor or itching.  PMHx: She  has a past medical history of DDD (degenerative disc disease), cervical, H/O colonoscopy (2017), Kidney stones, and Low blood pressure. Also,  has a past surgical history that includes Foot surgery (Left); Cervix surgery (POLYP); Colonoscopy (2013); and lithotripsy., family history includes Breast cancer in her mother; Heart disease in her father.,  reports that she has never smoked. She has never used smokeless tobacco. She reports that she does not drink alcohol or use drugs.  She has a current medication list which includes the following prescription(s): ammonium lactate, cephalexin, cholecalciferol, clotrimazole-betamethasone, and nitrofurantoin (macrocrystal-monohydrate). Also, is allergic to onion and levaquin [levofloxacin in d5w].  Review of Systems  Constitutional: Negative for chills, fever and malaise/fatigue.  HENT: Negative for congestion, sinus pain and sore throat.   Eyes: Negative for blurred vision and pain.  Respiratory: Negative for cough and wheezing.   Cardiovascular: Negative for chest pain and leg swelling.  Gastrointestinal: Negative for abdominal pain, constipation, diarrhea, heartburn, nausea and  vomiting.  Genitourinary: Negative for dysuria, frequency, hematuria and urgency.  Musculoskeletal: Negative for back pain, joint pain, myalgias and neck pain.  Skin: Negative for itching and rash.  Neurological: Negative for dizziness, tremors and weakness.  Endo/Heme/Allergies: Does not bruise/bleed easily.  Psychiatric/Behavioral: Negative for depression. The patient is not nervous/anxious and does not have insomnia.    Objective: BP (!) 142/82   Ht 5\' 6"  (1.676 m)   Wt 175 lb (79.4 kg)   BMI 28.25 kg/m  Physical Exam Constitutional:      General: She is not in acute distress.    Appearance: She is well-developed.  Musculoskeletal: Normal range of motion.  Neurological:     Mental Status: She is alert and oriented to person, place, and time.  Skin:    General: Skin is warm and dry.  Vitals signs reviewed.   Back: No CVAT  Results for orders placed or performed in visit on 06/12/18  POCT urinalysis dipstick  Result Value Ref Range   Color, UA     Clarity, UA clear    Glucose, UA Negative Negative   Bilirubin, UA neg    Ketones, UA neg    Spec Grav, UA 1.010 1.010 - 1.025   Blood, UA trace    pH, UA 5.0 5.0 - 8.0   Protein, UA Negative Negative   Urobilinogen, UA 1.0 0.2 or 1.0 E.U./dL   Nitrite, UA neg    Leukocytes, UA Negative Negative   Appearance     Odor      ASSESSMENT/PLAN:   Problem List Items Addressed This Visit      Genitourinary   Vulvar candidiasis   Relevant Medications   clotrimazole-betamethasone (  LOTRISONE) cream     Other   Dysuria - Primary   Relevant Medications   nitrofurantoin, macrocrystal-monohydrate, (MACROBID) 100 MG capsule   Other Relevant Orders   POCT urinalysis dipstick (Completed)   Urine Culture    Consider suppression if become recurrent again w UTI May be other etiology as UA neg today, yet her history is very specific for UTI Hematuria Trace noted, kidney stone and other etiology discussed  [name], MD, [facility], [facility] 06/12/2018  3:58 PM

## 2018-06-12 NOTE — Telephone Encounter (Signed)
Patient is calling with symptoms of uti. Please advise urine culture drop off or visit type.

## 2018-11-04 ENCOUNTER — Telehealth: Payer: Self-pay

## 2018-11-04 NOTE — Telephone Encounter (Signed)
Pt calling; vaginal u/s was suggested by NP at [facility].  NP was going to set it up to be done there([facility]). Pt had rather have it done here if possible.  Please call.  [phone]

## 2018-11-06 ENCOUNTER — Other Ambulatory Visit: Payer: Self-pay | Admitting: Maternal Newborn

## 2018-11-06 DIAGNOSIS — R102 Pelvic and perineal pain: Secondary | ICD-10-CM

## 2018-11-06 NOTE — Progress Notes (Signed)
NP from [place] recommended transvaginal ultrasound for episode of shooting pelvic pain; patient requested evaluation be done at [facility]. Imaging order entered. Patient will call for appointment.

## 2018-11-14 ENCOUNTER — Other Ambulatory Visit: Payer: Self-pay

## 2018-11-14 ENCOUNTER — Ambulatory Visit (INDEPENDENT_AMBULATORY_CARE_PROVIDER_SITE_OTHER): Payer: BC Managed Care – PPO

## 2018-11-14 ENCOUNTER — Encounter: Payer: Self-pay | Admitting: Obstetrics & Gynecology

## 2018-11-14 ENCOUNTER — Ambulatory Visit (INDEPENDENT_AMBULATORY_CARE_PROVIDER_SITE_OTHER): Payer: BC Managed Care – PPO | Admitting: Obstetrics & Gynecology

## 2018-11-14 VITALS — BP 150/80 | Ht 65.5 in | Wt 172.0 lb

## 2018-11-14 DIAGNOSIS — D219 Benign neoplasm of connective and other soft tissue, unspecified: Secondary | ICD-10-CM

## 2018-11-14 DIAGNOSIS — R102 Pelvic and perineal pain: Secondary | ICD-10-CM

## 2018-11-14 DIAGNOSIS — D251 Intramural leiomyoma of uterus: Secondary | ICD-10-CM

## 2018-11-14 DIAGNOSIS — D252 Subserosal leiomyoma of uterus: Secondary | ICD-10-CM

## 2018-11-14 NOTE — Progress Notes (Signed)
  HPI: Pt is a 63 yo WF w stress over the last several months related to Covid and helping w grandkids online schooling, says she has felt a shooting pelvic to vagina pain at times, no other concerns. No bleeding.  No prior h/o female surgery or problems.  Cervical polyp removed 8 yeras ago.  No h/o fibroids.  Ultrasound demonstrates 2 <1cm fibroids  PMHx: She  has a past medical history of DDD (degenerative disc disease), cervical, H/O colonoscopy (2017), Kidney stones, and Low blood pressure. Also,  has a past surgical history that includes Foot surgery (Left); Cervix surgery (POLYP); Colonoscopy (2013); and lithotripsy., family history includes Breast cancer in her mother; Heart disease in her father.,  reports that she has never smoked. She has never used smokeless tobacco. She reports that she does not drink alcohol or use drugs.  She has a current medication list which includes the following prescription(s): ammonium lactate, cephalexin, cholecalciferol, clotrimazole-betamethasone, and nitrofurantoin (macrocrystal-monohydrate). Also, is allergic to onion and levaquin [levofloxacin in d5w].  Review of Systems  All other systems reviewed and are negative.   Objective: BP (!) 150/80   Ht 5' 5.5" (1.664 m)   Wt 172 lb (78 kg)   BMI 28.19 kg/m   Physical examination Constitutional NAD, Conversant  Skin No rashes, lesions or ulceration.   Extremities: Moves all appropriately.  Normal ROM for age. No lymphadenopathy.  Neuro: Grossly intact  Psych: Oriented to PPT.  Normal mood. Normal affect.   US Pelvis Transvaginal Non-ob (tv Only)  Result Date: 11/14/2018 Patient Name: Olivia Richards DOB: 1955-11-18 MRN: [id number] ULTRASOUND REPORT Location: [facility] Date of Service: 11/14/2018 Indications:Pelvic Pain Findings: The uterus is retroverted and measures 5.7 x 4.8 x 2.8 cm. Echo texture is heterogenous with evidence of focal masses. Within the uterus are multiple suspected  fibroids measuring: Fibroid 1: 9.4 x 3.5 x 0.7 mm subserosal anterior Fibroid 2:5.6 x 6.6 x 5.1 mm intramural posterior The Endometrium measures 2.4 mm. Right Ovary measures 2.0 x 0.8 x 0.9 cm cm. It is normal in appearance. Left Ovary measures 1.9 x 0.7 x 0.7 cm. It is normal in appearance. Survey of the adnexa demonstrates no adnexal masses. There is no free fluid in the cul de sac. Impression: 1. Thin endometrium 2. The uterus is heterogeneous 3. There are a few small, ill-defined fibroids in the uterus. 4. Normal appearing ovaries. 5. There is a small, simple cyst in the myometrium measuring 2 x 4 x 4 mm. Recommendations: 1.Clinical correlation with the patient's History and Physical Exam. Gweneth Dimitri, RT Review of ULTRASOUND.    I have personally reviewed images and report of recent ultrasound done at [facility].    Plan of management to be discussed with patient. [name], MD, [facility], [facility] 11/14/2018  11:24 AM   Assessment:    ICD-10-CM   1. Vaginal pain  R10.2   2. Fibroid  D21.9   Unlikely fibroid source of pain.  May be related to slight POP, or stress.  Monitor for degree and frequency of pain.  Pt reassured.  A total of 15 minutes were spent face-to-face with the patient during this encounter and over half of that time dealt with counseling and coordination of care.  [name], MD, [facility], [facility] 11/14/2018  11:25 AM

## 2018-11-19 ENCOUNTER — Ambulatory Visit: Payer: Self-pay | Admitting: Family Medicine

## 2018-11-20 ENCOUNTER — Ambulatory Visit: Payer: BC Managed Care – PPO | Admitting: Family Medicine

## 2018-11-20 ENCOUNTER — Other Ambulatory Visit: Payer: Self-pay

## 2018-11-20 ENCOUNTER — Encounter: Payer: Self-pay | Admitting: Family Medicine

## 2018-11-20 VITALS — BP 120/80 | HR 80 | Ht 65.5 in | Wt 174.0 lb

## 2018-11-20 DIAGNOSIS — E7801 Familial hypercholesterolemia: Secondary | ICD-10-CM

## 2018-11-20 DIAGNOSIS — Z1159 Encounter for screening for other viral diseases: Secondary | ICD-10-CM | POA: Diagnosis not present

## 2018-11-20 DIAGNOSIS — Z0000 Encounter for general adult medical examination without abnormal findings: Secondary | ICD-10-CM

## 2018-11-20 NOTE — Patient Instructions (Signed)

## 2018-11-20 NOTE — Progress Notes (Signed)
Date:  11/20/2018   Name:  Olivia Richards   DOB:  June 13, 1955   MRN:  [id number]   Chief Complaint: Establish Care (keep chart active)  Patient is a 63 year old female who presents for a healthcare maintenance exam. The patient reports the following problems: need hepatitis c surveillance/lipid maintenance. Health maintenance has been reviewed hepatitis c.  Hyperlipidemia This is a chronic problem. The current episode started more than 1 year ago. The problem is controlled. Recent lipid tests were reviewed and are normal. Pertinent negatives include no chest pain, myalgias or shortness of breath. Current antihyperlipidemic treatment includes diet change. There are no compliance problems.     Review of Systems  Constitutional: Negative.  Negative for chills, fatigue, fever and unexpected weight change.  HENT: Negative for congestion, ear discharge, ear pain, rhinorrhea, sinus pressure, sneezing and sore throat.   Eyes: Negative for photophobia, pain, discharge, redness and itching.  Respiratory: Negative for cough, shortness of breath, wheezing and stridor.   Cardiovascular: Negative for chest pain, palpitations and leg swelling.  Gastrointestinal: Negative for abdominal pain, blood in stool, constipation, diarrhea, nausea and vomiting.       Hemorrhoids  Endocrine: Negative for cold intolerance, heat intolerance, polydipsia, polyphagia and polyuria.  Genitourinary: Positive for menstrual problem. Negative for dysuria, flank pain, frequency, hematuria, pelvic pain, urgency, vaginal bleeding and vaginal discharge.       Lining of uterus  Musculoskeletal: Positive for neck pain. Negative for arthralgias, back pain and myalgias.       DDD cervical  Skin: Negative for rash.  Allergic/Immunologic: Negative for environmental allergies and food allergies.  Neurological: Positive for numbness. Negative for dizziness, weakness, light-headedness and headaches.       Left arm /eval   Hematological: Negative for adenopathy. Does not bruise/bleed easily.  Psychiatric/Behavioral: Negative for dysphoric mood. The patient is not nervous/anxious.     Patient Active Problem List   Diagnosis Date Noted  . Dysuria 06/12/2018  . Vulvar candidiasis 06/12/2018    Allergies  Allergen Reactions  . Onion Other (See Comments)    Diarrhea and vomiting  . Levaquin [Levofloxacin In D5w] Itching    Past Surgical History:  Procedure Laterality Date  . CERVIX SURGERY  POLYP  . COLONOSCOPY  2013   Dr Gustavo Lah- every 5 yrs  . FOOT SURGERY Left   . lithotripsy      Social History   Tobacco Use  . Smoking status: Never Smoker  . Smokeless tobacco: Never Used  Substance Use Topics  . Alcohol use: No  . Drug use: No     Medication list has been reviewed and updated.  Current Meds  Medication Sig  . ammonium lactate (LAC-HYDRIN) 12 % lotion APPLY TO SKIN DAILY  . cholecalciferol (VITAMIN D) 1000 units tablet Take 1,000 Units by mouth daily.  . clotrimazole-betamethasone (LOTRISONE) cream Apply 1 application topically 2 (two) times daily.    PHQ 2/9 Scores 06/10/2015 02/19/2015  PHQ - 2 Score 0 1    BP Readings from Last 3 Encounters:  11/20/18 120/80  11/14/18 (!) 150/80  06/12/18 (!) 142/82    Physical Exam Vitals signs and nursing note reviewed.  Constitutional:      Appearance: She is well-developed.  HENT:     Head: Normocephalic.     Right Ear: Tympanic membrane, ear canal and external ear normal.     Left Ear: Tympanic membrane, ear canal and external ear normal.     Nose: Nose normal.  Mouth/Throat:     Lips: Pink.     Mouth: Mucous membranes are moist.     Pharynx: Oropharynx is clear.  Eyes:     General: Lids are normal. Vision grossly intact. Gaze aligned appropriately. No scleral icterus.       Left eye: No foreign body or hordeolum.     Conjunctiva/sclera: Conjunctivae normal.     Right eye: Right conjunctiva is not injected.     Left  eye: Left conjunctiva is not injected.     Pupils: Pupils are equal, round, and reactive to light.  Neck:     Musculoskeletal: Normal range of motion and neck supple.     Thyroid: No thyroid mass, thyromegaly or thyroid tenderness.     Vascular: No JVD.     Trachea: No tracheal deviation.  Cardiovascular:     Rate and Rhythm: Normal rate and regular rhythm.     Chest Wall: PMI is not displaced.     Pulses: Normal pulses.          Carotid pulses are 2+ on the right side and 2+ on the left side.      Radial pulses are 2+ on the right side and 2+ on the left side.       Femoral pulses are 2+ on the right side and 2+ on the left side.      Popliteal pulses are 2+ on the right side and 2+ on the left side.       Dorsalis pedis pulses are 2+ on the right side and 2+ on the left side.       Posterior tibial pulses are 2+ on the right side and 2+ on the left side.     Heart sounds: Normal heart sounds and S1 normal. No murmur. No systolic murmur. No diastolic murmur. No friction rub. No gallop. No S3 or S4 sounds.   Pulmonary:     Effort: Pulmonary effort is normal. No respiratory distress.     Breath sounds: Normal breath sounds. No decreased breath sounds, wheezing, rhonchi or rales.  Abdominal:     General: Bowel sounds are normal.     Palpations: Abdomen is soft. There is no hepatomegaly or mass.     Tenderness: There is no abdominal tenderness. There is no guarding or rebound.  Musculoskeletal: Normal range of motion.        General: No tenderness.     Right lower leg: No edema.     Left lower leg: No edema.  Lymphadenopathy:     Cervical: No cervical adenopathy.  Skin:    General: Skin is warm.     Findings: No rash.  Neurological:     Mental Status: She is alert and oriented to person, place, and time.     Cranial Nerves: Cranial nerves are intact. No cranial nerve deficit.     Deep Tendon Reflexes: Reflexes normal.  Psychiatric:        Mood and Affect: Mood is not anxious or  depressed.     Wt Readings from Last 3 Encounters:  11/20/18 174 lb (78.9 kg)  11/14/18 172 lb (78 kg)  06/12/18 175 lb (79.4 kg)    BP 120/80   Pulse 80   Ht 5' 5.5" (1.664 m)   Wt 174 lb (78.9 kg)   BMI 28.51 kg/m   Assessment and Plan:   1. Healthcare maintenance Guilda Petitt is a 62 y.o. female who presents today for her Complete Annual Exam. She feels  well. She reports exercising . She reports she is sleeping well.   2. Familial hypercholesterolemia Chronic.  Diet controlled.  Check lipid panel. - Lipid Panel With LDL/HDL Ratio  3. Need for hepatitis C screening test Patient meets criteria for screening for hepatitis C.  Obtain hepatitis C antibody for evaluation. - Hepatitis C antibody

## 2018-11-21 LAB — LIPID PANEL WITH LDL/HDL RATIO
Cholesterol, Total: 225 mg/dL — ABNORMAL HIGH (ref 100–199)
HDL: 60 mg/dL (ref >39)
LDL Chol Calc (NIH): 145 mg/dL — ABNORMAL HIGH (ref 0–99)
LDL/HDL Ratio: 2.4 ratio (ref 0.0–3.2)
Triglycerides: 115 mg/dL (ref 0–149)
VLDL Cholesterol Cal: 20 mg/dL (ref 5–40)

## 2018-11-21 LAB — HEPATITIS C ANTIBODY: Hep C Virus Ab: <0.1 s/co ratio (ref 0.0–0.9)

## 2018-12-02 ENCOUNTER — Telehealth: Payer: Self-pay

## 2018-12-02 ENCOUNTER — Other Ambulatory Visit: Payer: Self-pay | Admitting: Obstetrics and Gynecology

## 2018-12-02 DIAGNOSIS — R300 Dysuria: Secondary | ICD-10-CM

## 2018-12-02 MED ORDER — NITROFURANTOIN MONOHYD MACRO 100 MG PO CAPS
100.0000 mg | ORAL_CAPSULE | Freq: Two times a day (BID) | ORAL | 0 refills | Status: DC
Start: 2018-12-02 — End: 2018-12-10

## 2018-12-02 NOTE — Progress Notes (Signed)
Rx RF macrobid for UTI sx.

## 2018-12-02 NOTE — Telephone Encounter (Signed)
Pt calling c/o feels like another UTI coming on.  PH told her if she felt like this to call and he would call in rx of macrobid for her.  Pt is supposed to go out of town this pm.  [phone]

## 2018-12-02 NOTE — Telephone Encounter (Signed)
Pt aware.  She recv'd text from pharm stating ins wasn't covering.  Adv I didn't know why.  Pt will ck into it.

## 2018-12-02 NOTE — Telephone Encounter (Signed)
Dr Kenton Kingfisher is not here today pt may need to be seen

## 2018-12-02 NOTE — Telephone Encounter (Signed)
Pls notify pt Rx macrobid eRxd. F/u with [facility] if sx persist/recur.

## 2018-12-09 NOTE — Telephone Encounter (Signed)
Please call pt to schedule appt

## 2018-12-09 NOTE — Telephone Encounter (Signed)
Pt has finished her antibiotic and is still feeling some kind of way. Says she is feeling a lot of pressure when urinates and some back pain. Is not having urinary frequency or burning when urinating, no blood in urine or when wipes. Please advise if she needs to be seen.

## 2018-12-09 NOTE — Telephone Encounter (Signed)
Patient is schedule 12/10/18 with [facility]

## 2018-12-09 NOTE — Progress Notes (Signed)
Juline Patch, MD   Chief Complaint  Patient presents with  . Urinary Tract Infection    finished recent UTI antibiotic, as of now low back pain    HPI:      [name] is a 63 y.o. G2P2002 who LMP was No LMP recorded. Patient is postmenopausal., presents today for UTI f/u. Pt has a "tingle" vaginally, usually a sign of a UTI. Had sx last wk with frequency and pressure with LBP. Treated empirically with macrobid since going out of town without sx relief. Clayton Lefort still persists today but pt denies current pressure/LBP now. Had cloudy urine recently and frequency with good flow. Needs urine checked. No vag sx or hematuria. Hx of frequent UTIs when younger, triggered by sweating and put on macrobid as preventive. Sx resolved over time but are starting to recur again. Hx of kidney stones in past. Has not used any vag ERT. Last pap 12/19.  Patient Active Problem List   Diagnosis Date Noted  . Dysuria 06/12/2018  . Vulvar candidiasis 06/12/2018    Past Surgical History:  Procedure Laterality Date  . CERVIX SURGERY  POLYP  . COLONOSCOPY  2013   Dr Gustavo Lah- every 5 yrs  . FOOT SURGERY Left   . lithotripsy      Family History  Problem Relation Age of Onset  . Breast cancer Mother   . Heart disease Father     Social History   Socioeconomic History  . Marital status: Divorced    Spouse name: Not on file  . Number of children: Not on file  . Years of education: Not on file  . Highest education level: Not on file  Occupational History  . Not on file  Social Needs  . Financial resource strain: Not on file  . Food insecurity    Worry: Not on file    Inability: Not on file  . Transportation needs    Medical: Not on file    Non-medical: Not on file  Tobacco Use  . Smoking status: Never Smoker  . Smokeless tobacco: Never Used  Substance and Sexual Activity  . Alcohol use: No  . Drug use: No  . Sexual activity: Not Currently    Birth control/protection:  Post-menopausal  Lifestyle  . Physical activity    Days per week: Not on file    Minutes per session: Not on file  . Stress: Not on file  Relationships  . Social Herbalist on phone: Not on file    Gets together: Not on file    Attends religious service: Not on file    Active member of club or organization: Not on file    Attends meetings of clubs or organizations: Not on file    Relationship status: Not on file  . Intimate partner violence    Fear of current or ex partner: Not on file    Emotionally abused: Not on file    Physically abused: Not on file    Forced sexual activity: Not on file  Other Topics Concern  . Not on file  Social History Narrative  . Not on file    Outpatient Medications Prior to Visit  Medication Sig Dispense Refill  . ammonium lactate (LAC-HYDRIN) 12 % lotion APPLY TO SKIN DAILY 227 g 2  . cholecalciferol (VITAMIN D) 1000 units tablet Take 1,000 Units by mouth daily.    . clotrimazole-betamethasone (LOTRISONE) cream Apply 1 application topically 2 (two) times daily. [place]  g 0  . hydrocortisone 2.5 % cream APPLY TOPICALLY 2 (TWO) TIMES DAILY FOR 10 DAYS    . nitrofurantoin, macrocrystal-monohydrate, (MACROBID) 100 MG capsule Take 1 capsule (100 mg total) by mouth 2 (two) times daily. 10 capsule 0   No facility-administered medications prior to visit.       ROS:  Review of Systems  Constitutional: Negative for fever.  Gastrointestinal: Negative for blood in stool, constipation, diarrhea, nausea and vomiting.  Genitourinary: Positive for dysuria. Negative for dyspareunia, flank pain, frequency, hematuria, urgency, vaginal bleeding, vaginal discharge and vaginal pain.  Musculoskeletal: Negative for back pain.  Skin: Negative for rash.  BREAST: No symptoms   OBJECTIVE:   Vitals:  BP (!) 142/80   Ht 5' 5.5" (1.664 m)   Wt 173 lb (78.5 kg)   BMI 28.35 kg/m   Physical Exam Vitals signs reviewed.  Constitutional:      Appearance: She  is well-developed.  Neck:     Musculoskeletal: Normal range of motion.  Pulmonary:     Effort: Pulmonary effort is normal.  Musculoskeletal: Normal range of motion.  Skin:    General: Skin is warm and dry.  Neurological:     General: No focal deficit present.     Mental Status: She is alert and oriented to person, place, and time.     Cranial Nerves: No cranial nerve deficit.  Psychiatric:        Mood and Affect: Mood normal.        Behavior: Behavior normal.        Thought Content: Thought content normal.        Judgment: Judgment normal.     Results: Results for orders placed or performed in visit on 12/10/18 (from the past 24 hour(s))  POCT Urinalysis Dipstick     Status: Abnormal   Collection Time: 12/10/18  3:15 PM  Result Value Ref Range   Color, UA yellow    Clarity, UA clear    Glucose, UA Negative Negative   Bilirubin, UA neg    Ketones, UA neg    Spec Grav, UA 1.010 1.010 - 1.025   Blood, UA neg    pH, UA 7.5 5.0 - 8.0   Protein, UA Negative Negative   Urobilinogen, UA     Nitrite, UA neg    Leukocytes, UA Trace (A) Negative   Appearance     Odor       Assessment/Plan: Dysuria - Plan: POCT Urinalysis Dipstick, Urine Culture; Neg UA. Check C&S. Will f/u with results. May need vag ERT if UTI sx become recurrent. Can also just place externally at urethra. No premarin samples today. Will f/u with culture and disposition. F/u sooner if sx worsen.    Return if symptoms worsen or fail to improve.  Jassiel Flye B. Emelyn Roen, PA-C 12/10/2018 3:19 PM

## 2018-12-10 ENCOUNTER — Other Ambulatory Visit: Payer: Self-pay

## 2018-12-10 ENCOUNTER — Ambulatory Visit (INDEPENDENT_AMBULATORY_CARE_PROVIDER_SITE_OTHER): Payer: BC Managed Care – PPO | Admitting: Obstetrics and Gynecology

## 2018-12-10 ENCOUNTER — Encounter: Payer: Self-pay | Admitting: Obstetrics and Gynecology

## 2018-12-10 VITALS — BP 142/80 | Ht 65.5 in | Wt 173.0 lb

## 2018-12-10 DIAGNOSIS — R300 Dysuria: Secondary | ICD-10-CM

## 2018-12-10 LAB — POCT URINALYSIS DIPSTICK
Bilirubin, UA: NEGATIVE
Blood, UA: NEGATIVE
Glucose, UA: NEGATIVE
Ketones, UA: NEGATIVE
Nitrite, UA: NEGATIVE
Protein, UA: NEGATIVE
Spec Grav, UA: 1.01 (ref 1.010–1.025)
pH, UA: 7.5 (ref 5.0–8.0)

## 2018-12-10 NOTE — Patient Instructions (Signed)
I value your feedback and entrusting us with your care. If you get a [facility] patient survey, I would appreciate you taking the time to let us know about your experience today. Thank you! 

## 2018-12-12 LAB — URINE CULTURE

## 2018-12-12 NOTE — Progress Notes (Signed)
Pls let pt know C&S neg. Without other UTI sx, can follow along for now. Can also make appt with RPH if sx persist for further eval since he does a lot of urogyn. Still don't have premarin samples to try. See [place] first prn sx. Thx

## 2018-12-12 NOTE — Progress Notes (Signed)
Called no answer, left detailed msg.

## 2018-12-18 ENCOUNTER — Ambulatory Visit (INDEPENDENT_AMBULATORY_CARE_PROVIDER_SITE_OTHER): Payer: BC Managed Care – PPO

## 2018-12-18 ENCOUNTER — Other Ambulatory Visit: Payer: Self-pay

## 2018-12-18 DIAGNOSIS — Z23 Encounter for immunization: Secondary | ICD-10-CM

## 2019-01-02 ENCOUNTER — Ambulatory Visit: Admission: RE | Admit: 2019-01-02 | Payer: BC Managed Care – PPO | Source: Home / Self Care

## 2019-01-02 ENCOUNTER — Encounter: Payer: Self-pay | Admitting: Family Medicine

## 2019-01-02 ENCOUNTER — Other Ambulatory Visit: Payer: Self-pay

## 2019-01-02 ENCOUNTER — Ambulatory Visit
Admission: RE | Admit: 2019-01-02 | Discharge: 2019-01-02 | Disposition: A | Discharge status: Home / Self Care | Payer: BC Managed Care – PPO | Source: Ambulatory Visit | Attending: Family Medicine | Admitting: Family Medicine

## 2019-01-02 ENCOUNTER — Ambulatory Visit: Payer: BC Managed Care – PPO | Admitting: Family Medicine

## 2019-01-02 VITALS — BP 138/70 | HR 76 | Ht 65.5 in | Wt 174.0 lb

## 2019-01-02 DIAGNOSIS — G549 Nerve root and plexus disorder, unspecified: Secondary | ICD-10-CM | POA: Diagnosis not present

## 2019-01-02 DIAGNOSIS — M5090 Cervical disc disorder, unspecified, unspecified cervical region: Secondary | ICD-10-CM | POA: Diagnosis not present

## 2019-01-02 MED ORDER — MELOXICAM 7.5 MG PO TABS: 7.5000 mg | ORAL_TABLET | Freq: Every day | ORAL | 0 refills | Status: DC

## 2019-01-02 NOTE — Progress Notes (Signed)
Date:  01/02/2019   Name:  Olivia Richards   DOB:  [birth year]   MRN:  [id number]   Chief Complaint: Neck Pain (hurting in trapezius area and biceps sore on the L) side. Dx with cervical radiculopathy in March of this year)  Neck Pain  This is a new problem. The current episode started in the past 7 days (Monday). The problem occurs daily. The problem has been waxing and waning. The pain is associated with lifting a heavy object (30 lb grandchild). The pain is present in the left side (trapezius). The quality of the pain is described as aching. The pain is at a severity of 7/10. The pain is moderate. The symptoms are aggravated by coughing and sneezing. Associated symptoms include numbness, tingling and weakness. Pertinent negatives include no chest pain, fever, headaches, pain with swallowing, photophobia, syncope, trouble swallowing or weight loss. She has tried NSAIDs and heat for the symptoms. The treatment provided no relief.    No results found for: CREATININE, BUN, NA, K, CL, CO2 Lab Results  Component Value Date   CHOL 225 (H) 11/20/2018   HDL 60 11/20/2018   LDLCALC 145 (H) 11/20/2018   TRIG 115 11/20/2018   CHOLHDL 3.6 01/25/2018   No results found for: TSH No results found for: HGBA1C   Review of Systems  Constitutional: Negative for chills, fever and weight loss.  HENT: Negative for drooling, ear discharge, ear pain, sore throat and trouble swallowing.   Eyes: Negative for photophobia.  Respiratory: Negative for cough, shortness of breath and wheezing.   Cardiovascular: Negative for chest pain, palpitations, leg swelling and syncope.  Gastrointestinal: Negative for abdominal pain, blood in stool, constipation, diarrhea and nausea.  Endocrine: Negative for polydipsia.  Genitourinary: Negative for dysuria, frequency, hematuria and urgency.  Musculoskeletal: Positive for neck pain. Negative for back pain and myalgias.  Skin: Negative for rash.  Allergic/Immunologic:  Negative for environmental allergies.  Neurological: Positive for tingling, weakness and numbness. Negative for dizziness and headaches.  Hematological: Does not bruise/bleed easily.  Psychiatric/Behavioral: Negative for suicidal ideas. The patient is not nervous/anxious.     Patient Active Problem List   Diagnosis Date Noted  . Dysuria 06/12/2018  . Vulvar candidiasis 06/12/2018    Allergies  Allergen Reactions  . Onion Other (See Comments)    Diarrhea and vomiting  . Levaquin [Levofloxacin In D5w] Itching    Past Surgical History:  Procedure Laterality Date  . CERVIX SURGERY  POLYP  . COLONOSCOPY  2013   Dr Gustavo Lah- every 5 yrs  . FOOT SURGERY Left   . lithotripsy      Social History   Tobacco Use  . Smoking status: Never Smoker  . Smokeless tobacco: Never Used  Substance Use Topics  . Alcohol use: No  . Drug use: No     Medication list has been reviewed and updated.  No outpatient medications have been marked as taking for the 01/02/19 encounter (Office Visit) with Juline Patch, MD.    [facility] 2/9 Scores 06/10/2015 02/19/2015  PHQ - 2 Score 0 1    BP Readings from Last 3 Encounters:  01/02/19 138/70  12/10/18 (!) 142/80  11/20/18 120/80    Physical Exam Vitals signs and nursing note reviewed.  Constitutional:      General: She is not in acute distress.    Appearance: She is not diaphoretic.  HENT:     Head: Normocephalic and atraumatic.     Right Ear: Tympanic membrane, ear  canal and external ear normal.     Left Ear: Tympanic membrane, ear canal and external ear normal.     Nose: Nose normal.     Mouth/Throat:     Mouth: Mucous membranes are moist.  Eyes:     General:        Right eye: No discharge.        Left eye: No discharge.     Conjunctiva/sclera: Conjunctivae normal.     Pupils: Pupils are equal, round, and reactive to light.  Neck:     Musculoskeletal: Normal range of motion and neck supple.     Thyroid: No thyromegaly.      Vascular: No JVD.  Cardiovascular:     Rate and Rhythm: Normal rate and regular rhythm.     Heart sounds: Normal heart sounds. No murmur. No friction rub. No gallop.   Pulmonary:     Effort: Pulmonary effort is normal.     Breath sounds: Normal breath sounds. No wheezing or rhonchi.  Abdominal:     General: Bowel sounds are normal.     Palpations: Abdomen is soft. There is no mass.     Tenderness: There is no abdominal tenderness. There is no guarding.  Musculoskeletal:     Cervical back: She exhibits decreased range of motion, tenderness and spasm.  Lymphadenopathy:     Cervical: No cervical adenopathy.  Skin:    General: Skin is warm and dry.  Neurological:     General: No focal deficit present.     Mental Status: She is alert.     Sensory: Sensation is intact.     Motor: Motor function is intact.     Deep Tendon Reflexes: Reflexes are normal and symmetric.     Reflex Scores:      Tricep reflexes are 2+ on the right side and 2+ on the left side.      Bicep reflexes are 2+ on the right side and 2+ on the left side.      Brachioradialis reflexes are 2+ on the right side and 2+ on the left side.      Patellar reflexes are 2+ on the right side and 2+ on the left side.      Achilles reflexes are 2+ on the right side and 2+ on the left side.    Wt Readings from Last 3 Encounters:  01/02/19 174 lb (78.9 kg)  12/10/18 173 lb (78.5 kg)  11/20/18 174 lb (78.9 kg)    BP 138/70   Pulse 76   Ht 5' 5.5" (1.664 m)   Wt 174 lb (78.9 kg)   BMI 28.51 kg/m   Assessment and Plan:  1. Cervical disc disease Acute.  Persistent.  Waxing and waning in nature.  Will initiate meloxicam 7.5 mg once a day.  She will take this for 2 weeks.  If this should continue beyond this time concern patient is to call on her next that will be orthopedic referral. - DG Cervical Spine Complete; Future  2. Radiculomyelopathy Pain radiates down the left arm with some tenderness actually over the trapezius  biceps and brachioradialis.  Pain is in the distribution of the radial nerve.  We will obtain a cervical spine film since his only been supposed that there is degenerative disc disease and will verify to what extent it can be determined by x-ray with next step being in MRI if necessary. - DG Cervical Spine Complete; Future

## 2019-01-08 ENCOUNTER — Other Ambulatory Visit: Payer: Self-pay

## 2019-01-08 DIAGNOSIS — M5090 Cervical disc disorder, unspecified, unspecified cervical region: Secondary | ICD-10-CM

## 2019-01-08 MED ORDER — CYCLOBENZAPRINE HCL 5 MG PO TABS: 5.0000 mg | ORAL_TABLET | Freq: Every day | ORAL | 0 refills | Status: DC

## 2019-01-08 NOTE — Progress Notes (Unsigned)
Sent in cyclobenzaprine

## 2019-02-06 ENCOUNTER — Other Ambulatory Visit: Payer: Self-pay | Admitting: Family Medicine

## 2019-03-17 NOTE — Progress Notes (Signed)
Juline Patch, MD   Chief Complaint  Patient presents with  . Vaginal Burning    internal x 5 days    HPI:      [name] is a 64 y.o. [id number] who LMP was No LMP recorded. Patient is postmenopausal., presents today for vaginal burning for 5 days without increased d/c, odor. Has a little dysuria but no other urin sx. Hx of UTIs in past. Has changed soaps recently to an "all natural" soap. No vaginal bleeding/PMB. She is not sex active.  She also feels like a tampon is inside vaginally and is uncomfortable. Sx started after straining for BM recently. Has a hx of uterine prolapse, discussed with Dr. Kenton Kingfisher in past. Had suggested possible TVH prn sx. Pt doing kegels.  Neg pap 12/19.  Patient Active Problem List   Diagnosis Date Noted  . Uterine prolapse 03/18/2019  . Dysuria 06/12/2018  . Vulvar candidiasis 06/12/2018    Past Surgical History:  Procedure Laterality Date  . CERVIX SURGERY  POLYP  . COLONOSCOPY  2013   Dr Gustavo Lah- every 5 yrs  . FOOT SURGERY Left   . lithotripsy      Family History  Problem Relation Age of Onset  . Breast cancer Mother   . Heart disease Father     Social History   Socioeconomic History  . Marital status: Divorced    Spouse name: Not on file  . Number of children: Not on file  . Years of education: Not on file  . Highest education level: Not on file  Occupational History  . Not on file  Tobacco Use  . Smoking status: Never Smoker  . Smokeless tobacco: Never Used  Substance and Sexual Activity  . Alcohol use: No  . Drug use: No  . Sexual activity: Not Currently    Birth control/protection: Post-menopausal  Other Topics Concern  . Not on file  Social History Narrative  . Not on file   Social Determinants of Health   Financial Resource Strain:   . Difficulty of Paying Living Expenses: Not on file  Food Insecurity:   . Worried About Charity fundraiser in the Last Year: Not on file  . Ran Out of Food in the  Last Year: Not on file  Transportation Needs:   . Lack of Transportation (Medical): Not on file  . Lack of Transportation (Non-Medical): Not on file  Physical Activity:   . Days of Exercise per Week: Not on file  . Minutes of Exercise per Session: Not on file  Stress:   . Feeling of Stress : Not on file  Social Connections:   . Frequency of Communication with Friends and Family: Not on file  . Frequency of Social Gatherings with Friends and Family: Not on file  . Attends Religious Services: Not on file  . Active Member of Clubs or Organizations: Not on file  . Attends Archivist Meetings: Not on file  . Marital Status: Not on file  Intimate Partner Violence:   . Fear of Current or Ex-Partner: Not on file  . Emotionally Abused: Not on file  . Physically Abused: Not on file  . Sexually Abused: Not on file    Outpatient Medications Prior to Visit  Medication Sig Dispense Refill  . hydrocortisone 2.5 % cream APPLY TOPICALLY 2 (TWO) TIMES DAILY FOR 10 DAYS    . ammonium lactate (LAC-HYDRIN) 12 % lotion APPLY TO SKIN DAILY (Patient not taking: Reported on  01/02/2019) 227 g 2  . cholecalciferol (VITAMIN D) 1000 units tablet Take 1,000 Units by mouth daily.    . meloxicam (MOBIC) 7.5 MG tablet TAKE 1 TABLET BY MOUTH EVERY DAY (Patient not taking: Reported on 03/18/2019) 30 tablet 0  . cyclobenzaprine (FLEXERIL) 5 MG tablet Take 1 tablet (5 mg total) by mouth at bedtime. 30 tablet 0   No facility-administered medications prior to visit.      ROS:  Review of Systems  Constitutional: Negative for fever.  Gastrointestinal: Negative for blood in stool, constipation, diarrhea, nausea and vomiting.  Genitourinary: Positive for dysuria and vaginal pain. Negative for dyspareunia, flank pain, frequency, hematuria, urgency, vaginal bleeding and vaginal discharge.  Musculoskeletal: Negative for back pain.  Skin: Negative for rash.   BREAST: No symptoms   OBJECTIVE:   Vitals:    BP (!) 150/90   Ht 5' 5.5" (1.664 m)   Wt 176 lb (79.8 kg)   BMI 28.84 kg/m   Physical Exam Vitals reviewed.  Constitutional:      Appearance: She is well-developed.  Pulmonary:     Effort: Pulmonary effort is normal.  Genitourinary:    Pubic Area: No rash.      Labia:        Right: No rash, tenderness or lesion.        Left: No rash, tenderness or lesion.      Vagina: Normal. No vaginal discharge, erythema or tenderness.     Cervix: Normal.     Uterus: Normal. With uterine prolapse. Not enlarged and not tender.      Adnexa: Right adnexa normal and left adnexa normal.       Right: No mass or tenderness.         Left: No mass or tenderness.       Comments: ERYTHEMA/TENDERNESS AT POST FOURCHETTE; NO ULCERATIVE LESIONS; GRADE 1-2 UTERINE PROLAPSE WITH VALSALVA Musculoskeletal:        General: Normal range of motion.     Cervical back: Normal range of motion.  Skin:    General: Skin is warm and dry.  Neurological:     General: No focal deficit present.     Mental Status: She is alert and oriented to person, place, and time.  Psychiatric:        Mood and Affect: Mood normal.        Behavior: Behavior normal.        Thought Content: Thought content normal.        Judgment: Judgment normal.     Results: Results for orders placed or performed in visit on 03/18/19 (from the past 24 hour(s))  POCT Wet Prep with KOH     Status: Normal   Collection Time: 03/18/19  9:55 AM  Result Value Ref Range   Trichomonas, UA Negative    Clue Cells Wet Prep HPF POC neg    Epithelial Wet Prep HPF POC     Yeast Wet Prep HPF POC neg    Bacteria Wet Prep HPF POC     RBC Wet Prep HPF POC     WBC Wet Prep HPF POC     KOH Prep POC Negative Negative  POCT Urinalysis Dipstick     Status: Abnormal   Collection Time: 03/18/19  9:56 AM  Result Value Ref Range   Color, UA yellow    Clarity, UA clear    Glucose, UA Negative Negative   Bilirubin, UA neg    Ketones, UA neg  Spec Grav, UA  1.015 1.010 - 1.025   Blood, UA neg    pH, UA 7.5 5.0 - 8.0   Protein, UA Negative Negative   Urobilinogen, UA     Nitrite, UA neg    Leukocytes, UA Trace (A) Negative   Appearance     Odor       Assessment/Plan: Vaginal burning - Plan: POCT Wet Prep with KOH; Neg wet prep/tender on exam. Has leftover Rx lotrisone crm from 10/20. Change to dove sens skin soap. F/u prn sx. If still persist, can try vag ERT to see if sx improve.   Dysuria - Plan: POCT Urinalysis Dipstick; Neg UA. Most likely due to vag sx. F/u prn  Uterine prolapse--f/u with Dr. Kenton Kingfisher for further eval/mgmt. May need TVH. Question if pelvic PT would help if surgery delayed.    Return if symptoms worsen or fail to improve.  Cristo Ausburn B. Evelyn Aguinaldo, PA-C 03/18/2019 9:58 AM

## 2019-03-18 ENCOUNTER — Ambulatory Visit (INDEPENDENT_AMBULATORY_CARE_PROVIDER_SITE_OTHER): Payer: Medicare PPO | Admitting: Obstetrics and Gynecology

## 2019-03-18 ENCOUNTER — Encounter: Payer: Self-pay | Admitting: Obstetrics and Gynecology

## 2019-03-18 ENCOUNTER — Other Ambulatory Visit: Payer: Self-pay

## 2019-03-18 VITALS — BP 150/90 | Ht 65.5 in | Wt 176.0 lb

## 2019-03-18 DIAGNOSIS — N949 Unspecified condition associated with female genital organs and menstrual cycle: Secondary | ICD-10-CM

## 2019-03-18 DIAGNOSIS — R300 Dysuria: Secondary | ICD-10-CM

## 2019-03-18 DIAGNOSIS — N814 Uterovaginal prolapse, unspecified: Secondary | ICD-10-CM | POA: Insufficient documentation

## 2019-03-18 LAB — POCT URINALYSIS DIPSTICK
Bilirubin, UA: NEGATIVE
Blood, UA: NEGATIVE
Glucose, UA: NEGATIVE
Ketones, UA: NEGATIVE
Nitrite, UA: NEGATIVE
Protein, UA: NEGATIVE
Spec Grav, UA: 1.015 (ref 1.010–1.025)
pH, UA: 7.5 (ref 5.0–8.0)

## 2019-03-18 LAB — POCT WET PREP WITH KOH
Clue Cells Wet Prep HPF POC: NEGATIVE
KOH Prep POC: NEGATIVE
Trichomonas, UA: NEGATIVE
Yeast Wet Prep HPF POC: NEGATIVE

## 2019-03-18 NOTE — Patient Instructions (Signed)
I value your feedback and entrusting us with your care. If you get a [facility] [facility] patient survey, I would appreciate you taking the time to let us know about your experience today. Thank you!  As of January 30, 2019, your lab results will be released to your MyChart immediately, before I even have a chance to see them. Please give me time to review them and contact you if there are any abnormalities. Thank you for your patience.  

## 2019-04-03 ENCOUNTER — Ambulatory Visit (INDEPENDENT_AMBULATORY_CARE_PROVIDER_SITE_OTHER): Payer: Medicare PPO | Admitting: Obstetrics & Gynecology

## 2019-04-03 ENCOUNTER — Encounter: Payer: Self-pay | Admitting: Obstetrics & Gynecology

## 2019-04-03 ENCOUNTER — Other Ambulatory Visit: Payer: Self-pay

## 2019-04-03 VITALS — BP 130/80 | Ht 65.5 in | Wt 177.0 lb

## 2019-04-03 DIAGNOSIS — N8111 Cystocele, midline: Secondary | ICD-10-CM

## 2019-04-03 DIAGNOSIS — R350 Frequency of micturition: Secondary | ICD-10-CM | POA: Diagnosis not present

## 2019-04-03 DIAGNOSIS — N814 Uterovaginal prolapse, unspecified: Secondary | ICD-10-CM

## 2019-04-03 NOTE — Progress Notes (Signed)
Uterine Prolapse/Cystocele/Rectocele Patient is a 64 yo G2 P2 WF who complains of a recent strain that was followed by a bulge sensation in her vagina.  She starined w a BM and felt a pop or a new sensation with this bulge that is quite uncomfortable. She has been noted on prior exams to have mild prolapse but has not had any significant symptoms until the last 3 weeks with this event.  She does now have urinary frequency more so, no nocturia or incontinence.  No bleeding.. Problem started 3 weeks ago. Symptoms include: prolapse of tissue with straining and discomfort: moderate. Symptoms have gradually worsened.   PMHx: She  has a past medical history of DDD (degenerative disc disease), cervical, H/O colonoscopy (2017), Kidney stones, and Low blood pressure. Also,  has a past surgical history that includes Foot surgery (Left); Cervix surgery (POLYP); Colonoscopy (2013); and lithotripsy., family history includes Breast cancer in her mother; Heart disease in her father.,  reports that she has never smoked. She has never used smokeless tobacco. She reports that she does not drink alcohol or use drugs.  She has a current medication list which includes the following prescription(s): ammonium lactate, cholecalciferol, hydrocortisone, and meloxicam. Also, is allergic to onion and levaquin [levofloxacin in d5w].   Review of Systems  Constitutional: Negative for chills, fever and malaise/fatigue.  HENT: Negative for congestion, sinus pain and sore throat.   Eyes: Negative for blurred vision and pain.  Respiratory: Negative for cough and wheezing.   Cardiovascular: Negative for chest pain and leg swelling.  Gastrointestinal: Negative for abdominal pain, constipation, diarrhea, heartburn, nausea and vomiting.  Genitourinary: Positive for frequency. Negative for dysuria, hematuria and urgency.  Musculoskeletal: Negative for back pain, joint pain, myalgias and neck pain.  Skin: Negative for itching and rash.    Neurological: Negative for dizziness, tremors and weakness.  Endo/Heme/Allergies: Does not bruise/bleed easily.  Psychiatric/Behavioral: Negative for depression. The patient is not nervous/anxious and does not have insomnia.     Objective: BP 130/80   Ht 5' 5.5" (1.664 m)   Wt 177 lb (80.3 kg)   BMI 29.01 kg/m  Physical Exam Constitutional:      General: She is not in acute distress.    Appearance: She is well-developed.  Genitourinary:     Pelvic exam was performed with patient supine.     Urethra, bladder, vagina and uterus normal.     No vaginal erythema or bleeding.     No cervical motion tenderness, discharge, polyp or nabothian cyst.     Uterus is mobile.     Uterus is not enlarged or tender.     No uterine mass detected.    Uterus is midaxial.     No right or left adnexal mass present.     Right adnexa not tender.     Left adnexa not tender.     Genitourinary Comments: Gr 3 uterine prolpase worse w valsalva Gr 1 cystocele Min rectocele No leakage w valsalva >30 degree qtip test  HENT:     Head: Normocephalic and atraumatic.     Nose: Nose normal.  Abdominal:     General: There is no distension.     Palpations: Abdomen is soft.     Tenderness: There is no abdominal tenderness.  Musculoskeletal:        General: Normal range of motion.  Neurological:     Mental Status: She is alert and oriented to person, place, and time.     Cranial  Nerves: No cranial nerve deficit.  Skin:    General: Skin is warm and dry.  Psychiatric:        Attention and Perception: Attention normal.        Mood and Affect: Mood and affect normal.        Speech: Speech normal.        Behavior: Behavior normal.        Thought Content: Thought content normal.        Judgment: Judgment normal.     ASSESSMENT/PLAN:     ICD-10-CM   1. Uterine prolapse  N81.4   2. Cystocele, midline  N81.11   Options of pessary, surgery, and expectant management/Kegels/PT all discussed w patient.   Decision made for TVH BSO AR.  Info given. Pros, cons and recovery discussed.  A total of 30 minutes were spent face-to-face with the patient as well as preparation, review, communication, and documentation during this encounter.     [name], MD, [facility], [facility] 04/03/2019  2:54 PM

## 2019-04-03 NOTE — Progress Notes (Signed)
PRE-OPERATIVE HISTORY AND PHYSICAL EXAM  HPI:  Olivia Richards is a 64 y.o. [id number] No LMP recorded. Patient is postmenopausal.; she is being admitted for surgery related to pelvic relaxation.  Patient is a 64 yo G2 P2 WF who complains of a recent strain that was followed by a bulge sensation in her vagina.  She starined w a BM and felt a pop or a new sensation with this bulge that is quite uncomfortable. She has been noted on prior exams to have mild prolapse but has not had any significant symptoms until the last 3 weeks with this event.  She does now have urinary frequency more so, no nocturia or incontinence.  No bleeding.. Problem started 3 weeks ago. Symptoms include: prolapse of tissue with straining and discomfort: moderate. Symptoms have gradually worsened.   PMHx: Past Medical History:  Diagnosis Date  . DDD (degenerative disc disease), cervical   . H/O colonoscopy 2017  . Kidney stones   . Low blood pressure    Past Surgical History:  Procedure Laterality Date  . CERVIX SURGERY  POLYP  . COLONOSCOPY  2013   Dr Gustavo Lah- every 5 yrs  . FOOT SURGERY Left   . lithotripsy     Family History  Problem Relation Age of Onset  . Breast cancer Mother   . Heart disease Father    Social History   Tobacco Use  . Smoking status: Never Smoker  . Smokeless tobacco: Never Used  Substance Use Topics  . Alcohol use: No  . Drug use: No    Current Outpatient Medications:  .  ammonium lactate (LAC-HYDRIN) 12 % lotion, APPLY TO SKIN DAILY (Patient not taking: Reported on 01/02/2019), Disp: 227 g, Rfl: 2 .  cholecalciferol (VITAMIN D) 1000 units tablet, Take 1,000 Units by mouth daily., Disp: , Rfl:  .  hydrocortisone 2.5 % cream, APPLY TOPICALLY 2 (TWO) TIMES DAILY FOR 10 DAYS, Disp: , Rfl:  .  meloxicam (MOBIC) 7.5 MG tablet, TAKE 1 TABLET BY MOUTH EVERY DAY (Patient not taking: Reported on 03/18/2019), Disp: 30 tablet, Rfl: 0 Allergies: Onion and Levaquin [levofloxacin in  d5w]  Review of Systems  Constitutional: Negative for chills, fever and malaise/fatigue.  HENT: Negative for congestion, sinus pain and sore throat.   Eyes: Negative for blurred vision and pain.  Respiratory: Negative for cough and wheezing.   Cardiovascular: Negative for chest pain and leg swelling.  Gastrointestinal: Negative for abdominal pain, constipation, diarrhea, heartburn, nausea and vomiting.  Genitourinary: Positive for frequency. Negative for dysuria, hematuria and urgency.  Musculoskeletal: Negative for back pain, joint pain, myalgias and neck pain.  Skin: Negative for itching and rash.  Neurological: Negative for dizziness, tremors and weakness.  Endo/Heme/Allergies: Does not bruise/bleed easily.  Psychiatric/Behavioral: Negative for depression. The patient is not nervous/anxious and does not have insomnia.     Objective: BP 130/80   Ht 5' 5.5" (1.664 m)   Wt 177 lb (80.3 kg)   BMI 29.01 kg/m   Filed Weights   04/03/19 1429  Weight: 177 lb (80.3 kg)   Physical Exam Constitutional:      General: She is not in acute distress.    Appearance: She is well-developed.  Genitourinary:     Pelvic exam was performed with patient supine.     Urethra, bladder, vagina, uterus and rectum normal.     No lesions in the vagina.     No vaginal bleeding.     No cervical motion tenderness, friability,  lesion or polyp.     Uterus is mobile.     Uterus is not enlarged.     No uterine mass detected.    Uterus is midaxial.     No right or left adnexal mass present.     Right adnexa not tender.     Left adnexa not tender.     Genitourinary Comments: Gr 3 uterine prolapse Gr 1 cystocele Min rectocele >30 degree qtip test  HENT:     Head: Normocephalic and atraumatic. No laceration.     Right Ear: Hearing normal.     Left Ear: Hearing normal.     Mouth/Throat:     Pharynx: Uvula midline.  Eyes:     Pupils: Pupils are equal, round, and reactive to light.  Neck:     Thyroid:  No thyromegaly.  Cardiovascular:     Rate and Rhythm: Normal rate and regular rhythm.     Heart sounds: No murmur. No friction rub. No gallop.   Pulmonary:     Effort: Pulmonary effort is normal. No respiratory distress.     Breath sounds: Normal breath sounds. No wheezing.  Chest:     Breasts:        Right: No mass, skin change or tenderness.        Left: No mass, skin change or tenderness.  Abdominal:     General: Bowel sounds are normal. There is no distension.     Palpations: Abdomen is soft.     Tenderness: There is no abdominal tenderness. There is no rebound.  Musculoskeletal:        General: Normal range of motion.     Cervical back: Normal range of motion and neck supple.  Neurological:     Mental Status: She is alert and oriented to person, place, and time.     Cranial Nerves: No cranial nerve deficit.  Skin:    General: Skin is warm and dry.  Psychiatric:        Judgment: Judgment normal.  Vitals reviewed.     Assessment: 1. Uterine prolapse   2. Cystocele, midline   Options for TVH BSO AR discussed and planned  I have had a careful discussion with this patient about all the options available and the risk/benefits of each. I have fully informed this patient that surgery may subject her to a variety of discomforts and risks: She understands that most patients have surgery with little difficulty, but problems can happen ranging from minor to fatal. These include nausea, vomiting, pain, bleeding, infection, poor healing, hernia, or formation of adhesions. Unexpected reactions may occur from any drug or anesthetic given. Unintended injury may occur to other pelvic or abdominal structures such as Fallopian tubes, ovaries, bladder, ureter (tube from kidney to bladder), or bowel. Nerves going from the pelvis to the legs may be injured. Any such injury may require immediate or later additional surgery to correct the problem. Excessive blood loss requiring transfusion is very  unlikely but possible. Dangerous blood clots may form in the legs or lungs. Physical and sexual activity will be restricted in varying degrees for an indeterminate period of time but most often 2-6 weeks.  Finally, she understands that it is impossible to list every possible undesirable effect and that the condition for which surgery is done is not always cured or significantly improved, and in rare cases may be even worse.Ample time was given to answer all questions.  [name], MD, [facility], [place]  04/03/2019  3:12 PM

## 2019-04-03 NOTE — Patient Instructions (Addendum)
PRE ADMISSION TESTING For Covid, prior to procedure Tuesday Mar 2, 9:00-10:00 [facility] entrance (drive up)  Results in 48-72 hours You will not receive notification if test results are negative. If positive for Covid19, your provider will notify you by phone, with additional instructions.   Vaginal Hysterectomy  A vaginal hysterectomy is a procedure to remove all or part of the uterus through a small incision in the vagina. In this procedure, your health care provider may remove your entire uterus, including the lower end (cervix). You may need a vaginal hysterectomy to treat:  Uterine fibroids.  A condition that causes the lining of the uterus to grow in other areas (endometriosis).  Problems with pelvic support.  Cancer of the cervix, ovaries, uterus, or tissue that lines the uterus (endometrium).  Excessive (dysfunctional) uterine bleeding. When removing your uterus, your health care provider may also remove the organs that produce eggs (ovaries) and the tubes that carry eggs to your uterus (fallopian tubes). After a vaginal hysterectomy, you will no longer be able to have a baby. You will also no longer get your menstrual period. Tell a health care provider about:  Any allergies you have.  All medicines you are taking, including vitamins, herbs, eye drops, creams, and over-the-counter medicines.  Any problems you or family members have had with anesthetic medicines.  Any blood disorders you have.  Any surgeries you have had.  Any medical conditions you have.  Whether you are pregnant or may be pregnant. What are the risks? Generally, this is a safe procedure. However, problems may occur, including:  Bleeding.  Infection.  A blood clot that forms in your leg and travels to your lungs (pulmonary embolism).  Damage to surrounding organs.  Pain during sex. What happens before the procedure?  Ask your health care provider what organs will be removed  during surgery.  Ask your health care provider about: ? Changing or stopping your regular medicines. This is especially important if you are taking diabetes medicines or blood thinners. ? Taking medicines such as aspirin and ibuprofen. These medicines can thin your blood. Do not take these medicines before your procedure if your health care provider instructs you not to.  Follow instructions from your health care provider about eating or drinking restrictions.  Do not use any tobacco products, such as cigarettes, chewing tobacco, and e-cigarettes. If you need help quitting, ask your health care provider.  Plan to have someone take you home after discharge from the hospital. What happens during the procedure?  To reduce your risk of infection: ? Your health care team will wash or sanitize their hands. ? Your skin will be washed with soap.  An IV tube will be inserted into one of your veins.  You may be given antibiotic medicine to help prevent infection.  You will be given one or more of the following: ? A medicine to help you relax (sedative). ? A medicine to numb the area (local anesthetic). ? A medicine to make you fall asleep (general anesthetic). ? A medicine that is injected into an area of your body to numb everything beyond the injection site (regional anesthetic).  Your surgeon will make an incision in your vagina.  Your surgeon will locate and remove all or part of your uterus.  Your ovaries and fallopian tubes may be removed at the same time.  The incision will be closed with stitches (sutures) that dissolve over time. The procedure may vary among health care providers and hospitals.  What happens after the procedure?  Your blood pressure, heart rate, breathing rate, and blood oxygen level will be monitored often until the medicines you were given have worn off.  You will be encouraged to get up and walk around after a few hours to help prevent complications.  You  may have IV tubes in place for a few days.  You will be given pain medicine as needed.  Do not drive for 24 hours if you were given a sedative. This information is not intended to replace advice given to you by your health care provider. Make sure you discuss any questions you have with your health care provider. Document Revised: 09/30/2015 Document Reviewed: 02/21/2015 Elsevier Patient Education  2020 [employer].

## 2019-04-03 NOTE — H&P (View-Only) (Signed)
PRE-OPERATIVE HISTORY AND PHYSICAL EXAM  HPI:  Olivia Richards is a 64 y.o. [id number] No LMP recorded. Patient is postmenopausal.; she is being admitted for surgery related to pelvic relaxation.  Patient is a 64 yo G2 P2 WF who complains of a recent strain that was followed by a bulge sensation in her vagina.  She starined w a BM and felt a pop or a new sensation with this bulge that is quite uncomfortable. She has been noted on prior exams to have mild prolapse but has not had any significant symptoms until the last 3 weeks with this event.  She does now have urinary frequency more so, no nocturia or incontinence.  No bleeding.. Problem started 3 weeks ago. Symptoms include: prolapse of tissue with straining and discomfort: moderate. Symptoms have gradually worsened.   PMHx: Past Medical History:  Diagnosis Date  . DDD (degenerative disc disease), cervical   . H/O colonoscopy 2017  . Kidney stones   . Low blood pressure    Past Surgical History:  Procedure Laterality Date  . CERVIX SURGERY  POLYP  . COLONOSCOPY  2013   Dr Gustavo Lah- every 5 yrs  . FOOT SURGERY Left   . lithotripsy     Family History  Problem Relation Age of Onset  . Breast cancer Mother   . Heart disease Father    Social History   Tobacco Use  . Smoking status: Never Smoker  . Smokeless tobacco: Never Used  Substance Use Topics  . Alcohol use: No  . Drug use: No    Current Outpatient Medications:  .  ammonium lactate (LAC-HYDRIN) 12 % lotion, APPLY TO SKIN DAILY (Patient not taking: Reported on 01/02/2019), Disp: 227 g, Rfl: 2 .  cholecalciferol (VITAMIN D) 1000 units tablet, Take 1,000 Units by mouth daily., Disp: , Rfl:  .  hydrocortisone 2.5 % cream, APPLY TOPICALLY 2 (TWO) TIMES DAILY FOR 10 DAYS, Disp: , Rfl:  .  meloxicam (MOBIC) 7.5 MG tablet, TAKE 1 TABLET BY MOUTH EVERY DAY (Patient not taking: Reported on 03/18/2019), Disp: 30 tablet, Rfl: 0 Allergies: Onion and Levaquin [levofloxacin in  d5w]  Review of Systems  Constitutional: Negative for chills, fever and malaise/fatigue.  HENT: Negative for congestion, sinus pain and sore throat.   Eyes: Negative for blurred vision and pain.  Respiratory: Negative for cough and wheezing.   Cardiovascular: Negative for chest pain and leg swelling.  Gastrointestinal: Negative for abdominal pain, constipation, diarrhea, heartburn, nausea and vomiting.  Genitourinary: Positive for frequency. Negative for dysuria, hematuria and urgency.  Musculoskeletal: Negative for back pain, joint pain, myalgias and neck pain.  Skin: Negative for itching and rash.  Neurological: Negative for dizziness, tremors and weakness.  Endo/Heme/Allergies: Does not bruise/bleed easily.  Psychiatric/Behavioral: Negative for depression. The patient is not nervous/anxious and does not have insomnia.     Objective: BP 130/80   Ht 5' 5.5" (1.664 m)   Wt 177 lb (80.3 kg)   BMI 29.01 kg/m   Filed Weights   04/03/19 1429  Weight: 177 lb (80.3 kg)   Physical Exam Constitutional:      General: She is not in acute distress.    Appearance: She is well-developed.  Genitourinary:     Pelvic exam was performed with patient supine.     Urethra, bladder, vagina, uterus and rectum normal.     No lesions in the vagina.     No vaginal bleeding.     No cervical motion tenderness, friability,  lesion or polyp.     Uterus is mobile.     Uterus is not enlarged.     No uterine mass detected.    Uterus is midaxial.     No right or left adnexal mass present.     Right adnexa not tender.     Left adnexa not tender.     Genitourinary Comments: Gr 3 uterine prolapse Gr 1 cystocele Min rectocele >30 degree qtip test  HENT:     Head: Normocephalic and atraumatic. No laceration.     Right Ear: Hearing normal.     Left Ear: Hearing normal.     Mouth/Throat:     Pharynx: Uvula midline.  Eyes:     Pupils: Pupils are equal, round, and reactive to light.  Neck:     Thyroid:  No thyromegaly.  Cardiovascular:     Rate and Rhythm: Normal rate and regular rhythm.     Heart sounds: No murmur. No friction rub. No gallop.   Pulmonary:     Effort: Pulmonary effort is normal. No respiratory distress.     Breath sounds: Normal breath sounds. No wheezing.  Chest:     Breasts:        Right: No mass, skin change or tenderness.        Left: No mass, skin change or tenderness.  Abdominal:     General: Bowel sounds are normal. There is no distension.     Palpations: Abdomen is soft.     Tenderness: There is no abdominal tenderness. There is no rebound.  Musculoskeletal:        General: Normal range of motion.     Cervical back: Normal range of motion and neck supple.  Neurological:     Mental Status: She is alert and oriented to person, place, and time.     Cranial Nerves: No cranial nerve deficit.  Skin:    General: Skin is warm and dry.  Psychiatric:        Judgment: Judgment normal.  Vitals reviewed.     Assessment: 1. Uterine prolapse   2. Cystocele, midline   Options for TVH BSO AR discussed and planned  I have had a careful discussion with this patient about all the options available and the risk/benefits of each. I have fully informed this patient that surgery may subject her to a variety of discomforts and risks: She understands that most patients have surgery with little difficulty, but problems can happen ranging from minor to fatal. These include nausea, vomiting, pain, bleeding, infection, poor healing, hernia, or formation of adhesions. Unexpected reactions may occur from any drug or anesthetic given. Unintended injury may occur to other pelvic or abdominal structures such as Fallopian tubes, ovaries, bladder, ureter (tube from kidney to bladder), or bowel. Nerves going from the pelvis to the legs may be injured. Any such injury may require immediate or later additional surgery to correct the problem. Excessive blood loss requiring transfusion is very  unlikely but possible. Dangerous blood clots may form in the legs or lungs. Physical and sexual activity will be restricted in varying degrees for an indeterminate period of time but most often 2-6 weeks.  Finally, she understands that it is impossible to list every possible undesirable effect and that the condition for which surgery is done is not always cured or significantly improved, and in rare cases may be even worse.Ample time was given to answer all questions.  [name], MD, [facility], [place]  04/03/2019  3:12 PM

## 2019-04-07 ENCOUNTER — Telehealth: Payer: Self-pay | Admitting: Obstetrics & Gynecology

## 2019-04-07 NOTE — Telephone Encounter (Signed)
Called patient regarding scheduled TVH/BSO, Anterior Repair, Cystoscopy on [id number] H&P done with Harris at last visit on [id number] - advised of Covid testing on 030221 at [place] 8-10:30am with instructions to quarantine at home until DOS  Will call pt back with appt/time for pre-op phone interview call.

## 2019-04-21 ENCOUNTER — Other Ambulatory Visit: Payer: Self-pay

## 2019-04-21 ENCOUNTER — Encounter
Admission: RE | Admit: 2019-04-21 | Discharge: 2019-04-21 | Disposition: A | Discharge status: Home / Self Care | Payer: Medicare PPO | Source: Ambulatory Visit | Attending: Obstetrics & Gynecology | Admitting: Obstetrics & Gynecology

## 2019-04-21 DIAGNOSIS — N8111 Cystocele, midline: Secondary | ICD-10-CM | POA: Diagnosis not present

## 2019-04-21 DIAGNOSIS — Z20822 Contact with and (suspected) exposure to covid-19: Secondary | ICD-10-CM | POA: Diagnosis not present

## 2019-04-21 DIAGNOSIS — R9431 Abnormal electrocardiogram [ECG] [EKG]: Secondary | ICD-10-CM | POA: Insufficient documentation

## 2019-04-21 DIAGNOSIS — Z79899 Other long term (current) drug therapy: Secondary | ICD-10-CM | POA: Diagnosis not present

## 2019-04-21 DIAGNOSIS — Z01818 Encounter for other preprocedural examination: Secondary | ICD-10-CM | POA: Insufficient documentation

## 2019-04-21 DIAGNOSIS — N814 Uterovaginal prolapse, unspecified: Secondary | ICD-10-CM | POA: Diagnosis not present

## 2019-04-21 HISTORY — DX: Personal history of urinary calculi: Z87.442

## 2019-04-21 NOTE — Patient Instructions (Signed)
Your procedure is scheduled on: Thurs 3/4 Report to Day Surgery. To find out your arrival time please call [phone] between 1PM - 3PM on Wed. 3/3.  Remember: Instructions that are not followed completely may result in serious medical risk,  up to and including death, or upon the discretion of your surgeon and anesthesiologist your  surgery may need to be rescheduled.     _X__ 1. Do not eat food after midnight the night before your procedure.                 No gum chewing or hard candies. You may drink clear liquids up to 2 hours                 before you are scheduled to arrive for your surgery- DO not drink clear                 liquids within 2 hours of the start of your surgery.                 Clear Liquids include:  water, apple juice without pulp, clear Gatorade, G2 or                  Gatorade Zero (avoid Red/Purple/Blue), Black Coffee or Tea (Do not add                 anything to coffee or tea). _____2.   Complete the carbohydrate drink provided to you, 2 hours before arrival.  __X__2.  On the morning of surgery brush your teeth with toothpaste and water, you                may rinse your mouth with mouthwash if you wish.  Do not swallow any toothpaste of mouthwash.     ___ 3.  No Alcohol for 24 hours before or after surgery.   ___ 4.  Do Not Smoke or use e-cigarettes For 24 Hours Prior to Your Surgery.                 Do not use any chewable tobacco products for at least 6 hours prior to                 surgery.  ____  5.  Bring all medications with you on the day of surgery if instructed.   __x__  6.  Notify your doctor if there is any change in your medical condition      (cold, fever, infections).     Do not wear jewelry, make-up, hairpins, clips or nail polish. Do not wear lotions, powders, or perfumes. You may wear deodorant. Do not shave 48 hours prior to surgery. Men may shave face and neck. Do not bring valuables to the hospital.     [facility] is not responsible for any belongings or valuables.  Contacts, dentures or bridgework may not be worn into surgery. Leave your suitcase in the car. After surgery it may be brought to your room. For patients admitted to the hospital, discharge time is determined by your treatment team.   Patients discharged the day of surgery will not be allowed to drive home.   Make arrangements for someone to be with you for the first 24 hours of your Same Day Discharge.    Please read over the following fact sheets that you were given:    __x__ Take these medicines the morning of surgery with A SIP OF WATER:  1.acetaminophen (TYLENOL) 500 MG tablet if needed   2.   3.   4.  5.  6.  ____ Fleet Enema (as directed)   __x__ Use CHG Soap (or wipes) as directed  ____ Use Benzoyl Peroxide Gel as instructed  ____ Use inhalers on the day of surgery  ____ Stop metformin 2 days prior to surgery    ____ Take 1/2 of usual insulin dose the night before surgery. No insulin the morning          of surgery.   ____ Stop Coumadin/Plavix/aspirin on   __x__ Stop Anti-inflammatories ibuprofen aleve or aspirin May take tylenol      ____ Stop supplements until after surgery.    ____ Bring C-Pap to the hospital.   Follow Incentive spirometry instructions.  Practice at home.  No need to bring to hospital Have stool softener available while taking narcotics to prevent constipation.

## 2019-04-22 ENCOUNTER — Encounter
Admission: RE | Admit: 2019-04-22 | Discharge: 2019-04-22 | Disposition: A | Discharge status: Home / Self Care | Payer: Medicare PPO | Source: Ambulatory Visit | Attending: Obstetrics & Gynecology | Admitting: Obstetrics & Gynecology

## 2019-04-22 ENCOUNTER — Other Ambulatory Visit: Payer: BC Managed Care – PPO

## 2019-04-22 DIAGNOSIS — Z01818 Encounter for other preprocedural examination: Secondary | ICD-10-CM | POA: Diagnosis not present

## 2019-04-22 LAB — TYPE AND SCREEN
ABO/RH(D): O POS
Antibody Screen: NEGATIVE

## 2019-04-22 LAB — CBC
HCT: 43 % (ref 36.0–46.0)
Hemoglobin: 14 g/dL (ref 12.0–15.0)
MCH: 26.3 pg (ref 26.0–34.0)
MCHC: 32.6 g/dL (ref 30.0–36.0)
MCV: 80.8 fL (ref 80.0–100.0)
Platelets: 314 10*3/uL (ref 150–400)
RBC: 5.32 MIL/uL — ABNORMAL HIGH (ref 3.87–5.11)
RDW: 13.4 % (ref 11.5–15.5)
WBC: 7.6 10*3/uL (ref 4.0–10.5)
nRBC: 0 % (ref 0.0–0.2)

## 2019-04-22 LAB — PROTIME-INR
INR: 1.1 (ref 0.8–1.2)
Prothrombin Time: 14.1 seconds (ref 11.4–15.2)

## 2019-04-22 LAB — COMPREHENSIVE METABOLIC PANEL
ALT: 27 U/L (ref 0–44)
AST: 22 U/L (ref 15–41)
Albumin: 4.5 g/dL (ref 3.5–5.0)
Alkaline Phosphatase: 89 U/L (ref 38–126)
Anion gap: 9 (ref 5–15)
BUN: 16 mg/dL (ref 8–23)
CO2: 24 mmol/L (ref 22–32)
Calcium: 9.2 mg/dL (ref 8.9–10.3)
Chloride: 105 mmol/L (ref 98–111)
Creatinine, Ser: 0.89 mg/dL (ref 0.44–1.00)
GFR calc Af Amer: >60 mL/min (ref >60)
GFR calc non Af Amer: >60 mL/min (ref >60)
Glucose, Bld: 120 mg/dL — ABNORMAL HIGH (ref 70–99)
Potassium: 4.2 mmol/L (ref 3.5–5.1)
Sodium: 138 mmol/L (ref 135–145)
Total Bilirubin: 0.9 mg/dL (ref 0.3–1.2)
Total Protein: 7.5 g/dL (ref 6.5–8.1)

## 2019-04-22 LAB — SARS CORONAVIRUS 2 (TAT 6-24 HRS): SARS Coronavirus 2: NEGATIVE

## 2019-04-22 LAB — APTT: aPTT: 27 seconds (ref 24–36)

## 2019-04-23 MED ORDER — SODIUM CHLORIDE 0.9 % IV SOLN
2.0000 g | INTRAVENOUS | Status: AC
  Administered 2019-04-24: 2 g via INTRAVENOUS

## 2019-04-24 ENCOUNTER — Ambulatory Visit: Payer: Medicare PPO | Admitting: Anesthesiology

## 2019-04-24 ENCOUNTER — Other Ambulatory Visit: Payer: Self-pay

## 2019-04-24 ENCOUNTER — Ambulatory Visit
Admission: RE | Admit: 2019-04-24 | Discharge: 2019-04-24 | Disposition: A | Discharge status: Home / Self Care | Payer: Medicare PPO | Attending: Obstetrics & Gynecology | Admitting: Obstetrics & Gynecology

## 2019-04-24 ENCOUNTER — Telehealth: Payer: Self-pay | Admitting: Obstetrics & Gynecology

## 2019-04-24 ENCOUNTER — Encounter: Payer: Self-pay | Admitting: Obstetrics & Gynecology

## 2019-04-24 ENCOUNTER — Encounter
Admission: RE | Disposition: A | Discharge status: Home / Self Care | Payer: Self-pay | Source: Home / Self Care | Attending: Obstetrics & Gynecology

## 2019-04-24 DIAGNOSIS — Z79899 Other long term (current) drug therapy: Secondary | ICD-10-CM | POA: Insufficient documentation

## 2019-04-24 DIAGNOSIS — N83202 Unspecified ovarian cyst, left side: Secondary | ICD-10-CM | POA: Diagnosis not present

## 2019-04-24 DIAGNOSIS — N83292 Other ovarian cyst, left side: Secondary | ICD-10-CM

## 2019-04-24 DIAGNOSIS — D251 Intramural leiomyoma of uterus: Secondary | ICD-10-CM | POA: Diagnosis not present

## 2019-04-24 DIAGNOSIS — Z8249 Family history of ischemic heart disease and other diseases of the circulatory system: Secondary | ICD-10-CM | POA: Insufficient documentation

## 2019-04-24 DIAGNOSIS — N841 Polyp of cervix uteri: Secondary | ICD-10-CM | POA: Insufficient documentation

## 2019-04-24 DIAGNOSIS — Z91018 Allergy to other foods: Secondary | ICD-10-CM | POA: Diagnosis not present

## 2019-04-24 DIAGNOSIS — Z87442 Personal history of urinary calculi: Secondary | ICD-10-CM | POA: Insufficient documentation

## 2019-04-24 DIAGNOSIS — N800 Endometriosis of uterus: Secondary | ICD-10-CM | POA: Diagnosis not present

## 2019-04-24 DIAGNOSIS — I959 Hypotension, unspecified: Secondary | ICD-10-CM | POA: Diagnosis not present

## 2019-04-24 DIAGNOSIS — N812 Incomplete uterovaginal prolapse: Secondary | ICD-10-CM | POA: Diagnosis not present

## 2019-04-24 DIAGNOSIS — N8111 Cystocele, midline: Secondary | ICD-10-CM | POA: Diagnosis not present

## 2019-04-24 DIAGNOSIS — N814 Uterovaginal prolapse, unspecified: Secondary | ICD-10-CM | POA: Diagnosis present

## 2019-04-24 DIAGNOSIS — N83201 Unspecified ovarian cyst, right side: Secondary | ICD-10-CM | POA: Diagnosis not present

## 2019-04-24 DIAGNOSIS — Z803 Family history of malignant neoplasm of breast: Secondary | ICD-10-CM | POA: Diagnosis not present

## 2019-04-24 DIAGNOSIS — N72 Inflammatory disease of cervix uteri: Secondary | ICD-10-CM

## 2019-04-24 DIAGNOSIS — Z881 Allergy status to other antibiotic agents status: Secondary | ICD-10-CM | POA: Diagnosis not present

## 2019-04-24 DIAGNOSIS — Z791 Long term (current) use of non-steroidal anti-inflammatories (NSAID): Secondary | ICD-10-CM | POA: Insufficient documentation

## 2019-04-24 DIAGNOSIS — N838 Other noninflammatory disorders of ovary, fallopian tube and broad ligament: Secondary | ICD-10-CM | POA: Diagnosis not present

## 2019-04-24 DIAGNOSIS — M5030 Other cervical disc degeneration, unspecified cervical region: Secondary | ICD-10-CM | POA: Insufficient documentation

## 2019-04-24 DIAGNOSIS — Z888 Allergy status to other drugs, medicaments and biological substances status: Secondary | ICD-10-CM | POA: Insufficient documentation

## 2019-04-24 DIAGNOSIS — D252 Subserosal leiomyoma of uterus: Secondary | ICD-10-CM | POA: Diagnosis not present

## 2019-04-24 DIAGNOSIS — M1990 Unspecified osteoarthritis, unspecified site: Secondary | ICD-10-CM | POA: Diagnosis not present

## 2019-04-24 DIAGNOSIS — N83291 Other ovarian cyst, right side: Secondary | ICD-10-CM

## 2019-04-24 HISTORY — PX: VAGINAL HYSTERECTOMY: SHX2639

## 2019-04-24 HISTORY — PX: CYSTOCELE REPAIR: SHX163

## 2019-04-24 LAB — ABO/RH: ABO/RH(D): O POS

## 2019-04-24 SURGERY — HYSTERECTOMY, VAGINAL
Anesthesia: General

## 2019-04-24 MED ORDER — PROPOFOL 10 MG/ML IV BOLUS
INTRAVENOUS | Status: DC | PRN
  Administered 2019-04-24: 150 mg via INTRAVENOUS

## 2019-04-24 MED ORDER — KETOROLAC TROMETHAMINE 30 MG/ML IJ SOLN: 30.0000 mg | Freq: Once | INTRAMUSCULAR | Status: DC | PRN

## 2019-04-24 MED ORDER — SUCCINYLCHOLINE CHLORIDE 20 MG/ML IJ SOLN
INTRAMUSCULAR | Status: AC
  Filled 2019-04-24: qty 1

## 2019-04-24 MED ORDER — MORPHINE SULFATE (PF) 4 MG/ML IV SOLN: 1.0000 mg | INTRAVENOUS | Status: DC | PRN

## 2019-04-24 MED ORDER — LIDOCAINE HCL (CARDIAC) PF 100 MG/5ML IV SOSY
PREFILLED_SYRINGE | INTRAVENOUS | Status: DC | PRN
  Administered 2019-04-24: 60 mg via INTRAVENOUS

## 2019-04-24 MED ORDER — FAMOTIDINE 20 MG PO TABS
ORAL_TABLET | ORAL | Status: AC
  Administered 2019-04-24: 08:00:00 20 mg via ORAL
  Filled 2019-04-24: qty 1

## 2019-04-24 MED ORDER — DEXMEDETOMIDINE HCL IN NACL 200 MCG/50ML IV SOLN
INTRAVENOUS | Status: AC
  Filled 2019-04-24: qty 50

## 2019-04-24 MED ORDER — HYDROCODONE-ACETAMINOPHEN 7.5-325 MG PO TABS: 1.0000 | ORAL_TABLET | Freq: Once | ORAL | Status: DC | PRN

## 2019-04-24 MED ORDER — MIDAZOLAM HCL 2 MG/2ML IJ SOLN
INTRAMUSCULAR | Status: DC | PRN
  Administered 2019-04-24: 2 mg via INTRAVENOUS

## 2019-04-24 MED ORDER — FENTANYL CITRATE (PF) 100 MCG/2ML IJ SOLN
INTRAMUSCULAR | Status: AC
  Filled 2019-04-24: qty 2

## 2019-04-24 MED ORDER — OXYCODONE-ACETAMINOPHEN 5-325 MG PO TABS
1.0000 | ORAL_TABLET | ORAL | Status: DC | PRN
  Administered 2019-04-24: 1 via ORAL

## 2019-04-24 MED ORDER — SUGAMMADEX SODIUM 200 MG/2ML IV SOLN
INTRAVENOUS | Status: AC
  Filled 2019-04-24: qty 2

## 2019-04-24 MED ORDER — ACETAMINOPHEN NICU IV SYRINGE 10 MG/ML
INTRAVENOUS | Status: AC
  Filled 2019-04-24: qty 1

## 2019-04-24 MED ORDER — LIDOCAINE-EPINEPHRINE 1 %-1:100000 IJ SOLN
INTRAMUSCULAR | Status: DC | PRN
  Administered 2019-04-24: 14 mL

## 2019-04-24 MED ORDER — FENTANYL CITRATE (PF) 100 MCG/2ML IJ SOLN
INTRAMUSCULAR | Status: AC
  Administered 2019-04-24: 25 ug via INTRAVENOUS
  Filled 2019-04-24: qty 2

## 2019-04-24 MED ORDER — OXYCODONE-ACETAMINOPHEN 5-325 MG PO TABS
ORAL_TABLET | ORAL | Status: AC
  Filled 2019-04-24: qty 1

## 2019-04-24 MED ORDER — ESTROGENS, CONJUGATED 0.625 MG/GM VA CREA
TOPICAL_CREAM | VAGINAL | Status: DC | PRN
  Administered 2019-04-24: 1 via VAGINAL

## 2019-04-24 MED ORDER — SEVOFLURANE IN SOLN
RESPIRATORY_TRACT | Status: AC
  Filled 2019-04-24: qty 250

## 2019-04-24 MED ORDER — MEPERIDINE HCL 50 MG/ML IJ SOLN: 6.2500 mg | INTRAMUSCULAR | Status: DC | PRN

## 2019-04-24 MED ORDER — ONDANSETRON HCL 4 MG/2ML IJ SOLN
INTRAMUSCULAR | Status: DC | PRN
  Administered 2019-04-24: 4 mg via INTRAVENOUS

## 2019-04-24 MED ORDER — ACETAMINOPHEN 160 MG/5ML PO SOLN
325.0000 mg | ORAL | Status: DC | PRN
  Filled 2019-04-24: qty 20.3

## 2019-04-24 MED ORDER — PROMETHAZINE HCL 25 MG/ML IJ SOLN: 6.2500 mg | INTRAMUSCULAR | Status: DC | PRN

## 2019-04-24 MED ORDER — EPHEDRINE SULFATE 50 MG/ML IJ SOLN
INTRAMUSCULAR | Status: DC | PRN
  Administered 2019-04-24: 10 mg via INTRAVENOUS

## 2019-04-24 MED ORDER — OXYCODONE-ACETAMINOPHEN 5-325 MG PO TABS: 1.0000 | ORAL_TABLET | ORAL | 0 refills | Status: DC | PRN

## 2019-04-24 MED ORDER — PROPOFOL 10 MG/ML IV BOLUS
INTRAVENOUS | Status: AC
  Filled 2019-04-24: qty 20

## 2019-04-24 MED ORDER — MIDAZOLAM HCL 2 MG/2ML IJ SOLN
INTRAMUSCULAR | Status: AC
  Filled 2019-04-24: qty 2

## 2019-04-24 MED ORDER — FAMOTIDINE 20 MG PO TABS: 20.0000 mg | ORAL_TABLET | Freq: Once | ORAL | Status: AC

## 2019-04-24 MED ORDER — ACETAMINOPHEN 325 MG PO TABS: 325.0000 mg | ORAL_TABLET | ORAL | Status: DC | PRN

## 2019-04-24 MED ORDER — ACETAMINOPHEN 325 MG PO TABS: 650.0000 mg | ORAL_TABLET | ORAL | Status: DC | PRN

## 2019-04-24 MED ORDER — ONDANSETRON HCL 4 MG/2ML IJ SOLN
INTRAMUSCULAR | Status: AC
  Filled 2019-04-24: qty 2

## 2019-04-24 MED ORDER — ESTROGENS, CONJUGATED 0.625 MG/GM VA CREA
TOPICAL_CREAM | VAGINAL | Status: AC
  Filled 2019-04-24: qty 30

## 2019-04-24 MED ORDER — LACTATED RINGERS IV SOLN: INTRAVENOUS | Status: DC

## 2019-04-24 MED ORDER — ROCURONIUM BROMIDE 50 MG/5ML IV SOLN
INTRAVENOUS | Status: AC
  Filled 2019-04-24: qty 1

## 2019-04-24 MED ORDER — DEXAMETHASONE SODIUM PHOSPHATE 10 MG/ML IJ SOLN
INTRAMUSCULAR | Status: AC
  Filled 2019-04-24: qty 1

## 2019-04-24 MED ORDER — SODIUM CHLORIDE 0.9 % IV SOLN
INTRAVENOUS | Status: AC
  Filled 2019-04-24: qty 2

## 2019-04-24 MED ORDER — ACETAMINOPHEN 650 MG RE SUPP
650.0000 mg | RECTAL | Status: DC | PRN
  Filled 2019-04-24: qty 1

## 2019-04-24 MED ORDER — ROCURONIUM BROMIDE 100 MG/10ML IV SOLN
INTRAVENOUS | Status: DC | PRN
  Administered 2019-04-24: 40 mg via INTRAVENOUS
  Administered 2019-04-24: 10 mg via INTRAVENOUS

## 2019-04-24 MED ORDER — LIDOCAINE HCL (PF) 2 % IJ SOLN
INTRAMUSCULAR | Status: AC
  Filled 2019-04-24: qty 5

## 2019-04-24 MED ORDER — FENTANYL CITRATE (PF) 100 MCG/2ML IJ SOLN
INTRAMUSCULAR | Status: DC | PRN
  Administered 2019-04-24: 100 ug via INTRAVENOUS

## 2019-04-24 MED ORDER — FENTANYL CITRATE (PF) 100 MCG/2ML IJ SOLN
25.0000 ug | INTRAMUSCULAR | Status: DC | PRN
  Administered 2019-04-24 (×3): 25 ug via INTRAVENOUS

## 2019-04-24 MED ORDER — SUGAMMADEX SODIUM 200 MG/2ML IV SOLN
INTRAVENOUS | Status: DC | PRN
  Administered 2019-04-24: 200 mg via INTRAVENOUS

## 2019-04-24 MED ORDER — DEXAMETHASONE SODIUM PHOSPHATE 10 MG/ML IJ SOLN
INTRAMUSCULAR | Status: DC | PRN
  Administered 2019-04-24: 8 mg via INTRAVENOUS

## 2019-04-24 MED ORDER — ACETAMINOPHEN 10 MG/ML IV SOLN
INTRAVENOUS | Status: DC | PRN
  Administered 2019-04-24: 1000 mg via INTRAVENOUS

## 2019-04-24 MED ORDER — LIDOCAINE-EPINEPHRINE 1 %-1:100000 IJ SOLN
INTRAMUSCULAR | Status: AC
  Filled 2019-04-24: qty 2

## 2019-04-24 SURGICAL SUPPLY — 58 items
BAG DRN RND TRDRP ANRFLXCHMBR (UROLOGICAL SUPPLIES) ×1
BAG URINE DRAIN 2000ML AR STRL (UROLOGICAL SUPPLIES) ×3 IMPLANT
BLADE SURG 15 STRL LF DISP TIS (BLADE) ×1 IMPLANT
BLADE SURG 15 STRL SS (BLADE) ×3
BLADE SURG SZ10 CARB STEEL (BLADE) ×3 IMPLANT
CANISTER SUCT 1200ML W/VALVE (MISCELLANEOUS) ×3 IMPLANT
CATH FOLEY 2WAY  5CC 16FR (CATHETERS) ×3
CATH FOLEY 2WAY 5CC 16FR (CATHETERS) ×1
CATH URTH 16FR FL 2W BLN LF (CATHETERS) ×1 IMPLANT
COVER WAND RF STERILE (DRAPES) ×3 IMPLANT
DRAPE 3/4 80X56 (DRAPES) ×3 IMPLANT
DRAPE PERI LITHO V/GYN (MISCELLANEOUS) ×3 IMPLANT
DRAPE UNDER BUTTOCK W/FLU (DRAPES) ×3 IMPLANT
ELECT CAUTERY BLADE 6.4 (BLADE) ×3 IMPLANT
ELECT REM PT RETURN 9FT ADLT (ELECTROSURGICAL) ×3
ELECTRODE REM PT RTRN 9FT ADLT (ELECTROSURGICAL) ×1 IMPLANT
GAUZE 4X4 16PLY RFD (DISPOSABLE) ×3 IMPLANT
GAUZE PACK 2X3YD (GAUZE/BANDAGES/DRESSINGS) ×3 IMPLANT
GAUZE PACKING 1/2X5YD (GAUZE/BANDAGES/DRESSINGS) ×2 IMPLANT
GLOVE BIO SURGEON STRL SZ8 (GLOVE) ×12 IMPLANT
GOWN STRL REUS W/ TWL LRG LVL3 (GOWN DISPOSABLE) ×3 IMPLANT
GOWN STRL REUS W/ TWL XL LVL3 (GOWN DISPOSABLE) ×1 IMPLANT
GOWN STRL REUS W/TWL LRG LVL3 (GOWN DISPOSABLE) ×9
GOWN STRL REUS W/TWL XL LVL3 (GOWN DISPOSABLE) ×3
KIT TURNOVER CYSTO (KITS) ×3 IMPLANT
KIT TURNOVER KIT A (KITS) ×3 IMPLANT
LABEL OR SOLS (LABEL) ×3 IMPLANT
NDL HPO THNWL 1X22GA REG BVL (NEEDLE) ×1 IMPLANT
NDL MAYO CATGUT SZ4 (NEEDLE) ×3 IMPLANT
NDL MAYO CATGUT SZ4 TCR NDL (NEEDLE) ×1 IMPLANT
NDL SPNL 22GX3.5 QUINCKE BK (NEEDLE) ×1 IMPLANT
NEEDLE HYPO 22GX1.5 SAFETY (NEEDLE) ×3 IMPLANT
NEEDLE SAFETY 22GX1 (NEEDLE) ×3
NEEDLE SPNL 22GX3.5 QUINCKE BK (NEEDLE) ×3 IMPLANT
NS IRRIG 500ML POUR BTL (IV SOLUTION) ×3 IMPLANT
PACK BASIN MINOR ARMC (MISCELLANEOUS) ×3 IMPLANT
PAD OB MATERNITY 4.3X12.25 (PERSONAL CARE ITEMS) ×3 IMPLANT
PAD PREP 24X41 OB/GYN DISP (PERSONAL CARE ITEMS) ×3 IMPLANT
SET CYSTO W/LG BORE CLAMP LF (SET/KITS/TRAYS/PACK) ×3 IMPLANT
SOL PREP PVP 2OZ (MISCELLANEOUS) ×3
SOLUTION PREP PVP 2OZ (MISCELLANEOUS) ×1 IMPLANT
SPONGE LAP 18X18 RF (DISPOSABLE) ×2 IMPLANT
STRAP SAFETY 5IN WIDE (MISCELLANEOUS) ×3 IMPLANT
SURGILUBE 2OZ TUBE FLIPTOP (MISCELLANEOUS) ×3 IMPLANT
SUT ETHIBOND NAB CT1 #1 30IN (SUTURE) ×8 IMPLANT
SUT VIC AB 0 CT1 27 (SUTURE) ×12
SUT VIC AB 0 CT1 27XCR 8 STRN (SUTURE) ×2 IMPLANT
SUT VIC AB 0 CT1 36 (SUTURE) ×3 IMPLANT
SUT VIC AB 1 CT1 36 (SUTURE) ×1 IMPLANT
SUT VIC AB 2-0 CT1 (SUTURE) ×2 IMPLANT
SUT VIC AB 2-0 CT1 27 (SUTURE) ×3
SUT VIC AB 2-0 CT1 TAPERPNT 27 (SUTURE) ×1 IMPLANT
SUT VIC AB 3-0 SH 27 (SUTURE)
SUT VIC AB 3-0 SH 27X BRD (SUTURE) ×1 IMPLANT
SYR 10ML LL (SYRINGE) ×6 IMPLANT
SYR BULB IRRIG 60ML STRL (SYRINGE) ×3 IMPLANT
SYR CONTROL 10ML LL (SYRINGE) ×3 IMPLANT
TAPE TRANSPORE STRL 2 31045 (GAUZE/BANDAGES/DRESSINGS) ×3 IMPLANT

## 2019-04-24 NOTE — Discharge Instructions (Signed)
Vaginal Hysterectomy, Care After Refer to this sheet in the next few weeks. These instructions provide you with information about caring for yourself after your procedure. Your health care provider may also give you more specific instructions. Your treatment has been planned according to current medical practices, but problems sometimes occur. Call your health care provider if you have any problems or questions after your procedure. What can I expect after the procedure? After the procedure, it is common to have:  Pain.  Soreness and numbness in your incision areas.  Vaginal bleeding and discharge.  Constipation.  Temporary problems emptying the bladder.  Feelings of sadness or other emotions. Follow these instructions at home: Medicines  Take over-the-counter and prescription medicines only as told by your health care provider.  If you were prescribed an antibiotic medicine, take it as told by your health care provider. Do not stop taking the antibiotic even if you start to feel better.  Do not drive or operate heavy machinery while taking prescription pain medicine. Activity  Return to your normal activities as told by your health care provider. Ask your health care provider what activities are safe for you.  Get regular exercise as told by your health care provider. You may be told to take short walks every day and go farther each time.  Do not lift anything that is heavier than 10 lb (4.5 kg). General instructions   Do not put anything in your vagina for 6 weeks after your surgery or as told by your health care provider. This includes tampons and douches.  Do not have sex until your health care provider says you can.  Do not take baths, swim, or use a hot tub until your health care provider approves.  Drink enough fluid to keep your urine clear or pale yellow.  Do not drive for 24 hours if you were given a sedative.  Keep all follow-up visits as told by your health  care provider. This is important. Contact a health care provider if:  Your pain medicine is not helping.  You have a fever.  You have redness, swelling, or pain at your incision site.  You have blood, pus, or a bad-smelling discharge from your vagina.  You continue to have difficulty urinating. Get help right away if:  You have severe abdominal or back pain.  You have heavy bleeding from your vagina.  You have chest pain or shortness of breath. This information is not intended to replace advice given to you by your health care provider. Make sure you discuss any questions you have with your health care provider. Document Revised: 09/30/2015 Document Reviewed: 02/21/2015 Elsevier Patient Education  2020 [employer]  

## 2019-04-24 NOTE — Anesthesia Procedure Notes (Signed)
Procedure Name: Intubation Performed by: Doreen Salvage, CRNA Pre-anesthesia Checklist: Patient identified, Emergency Drugs available, Suction available and Patient being monitored Patient Re-evaluated:Patient Re-evaluated prior to induction Oxygen Delivery Method: Circle system utilized Preoxygenation: Pre-oxygenation with 100% oxygen Induction Type: IV induction Ventilation: Mask ventilation without difficulty and Mask ventilation throughout procedure Laryngoscope Size: Mac and 3 Grade View: Grade I Tube type: Oral Tube size: 7.0 mm Number of attempts: 1 Airway Equipment and Method: Bite block Placement Confirmation: positive ETCO2 Secured at: 21 cm Tube secured with: Tape Dental Injury: Teeth and Oropharynx as per pre-operative assessment

## 2019-04-24 NOTE — Op Note (Signed)
Preoperative diagnosis: Uterine prolapse and pain  Postoperative diagnosis: Same  Procedure: Transvaginal hysterectomy with bilateral salpingo-oophorectomy and anterior colporrhaphy  Surgeon: [name], M.D.  Assistant: Cari Caraway, MD No other capable assistant available, in surgery requiring high level assistant.  Anesthesia: general  Findings: Small uterus and ovaries.  Estimated blood loss: 25 cc  Specimen: Uterus, tubes, and ovaries to pathology   Disposition: Tolerated procedure well  Procedure: Patient was taken to the OR where she was placed in dorsal lithotomy in [place]. She was prepped and draped in the usual sterile fashion. A timeout was performed. Foley is placed into bladder. A speculum was placed inside the vagina. The cervix was visualized and grasped with a tenaculum. 1% lidocaine with epinephrine were injected paracervically. A bovie was used to make a circumferential incision around the vagina. An opened sponge was used to dissect the vagina off the cervix. The posterior peritoneum was entered sharply with Mayo scissors.  The anterior peritoneal cavity was entered sharply with careful dissection of the bladder off the underlying cervix. A Heaney clamp was used to clamp first the left uterosacral ligament and cardinal which was then cut and Haney suture ligated with 0 Vicryl stitch, the stitch was held and later attached to the vaginal mucosa. Similarly the right uterosacral ligament was clamped cut and suture ligated.  Sequential clamping, transecting and suture ligating up the broad to the uterine arteries were taken until the tubo-ovarian pedicles were encountered. The uterus was then amputated. The left utero-ovarian pedicle grasped with a Heaney clamp. The right utero-ovarian pedicle was similarly grasped with the Heaney clamp. The right & left ovaries were easily visualized. The left ovary and tube were grasped with Babcock clamp and a Heaney clamp placed behind  this. Tube and ovary were removed and the IP ligated with a free tie followed by suture ligature. The right tube and ovary were similarly grasped with a Babcock clamp and a Heaney clamp used to clamp behind this. The tube and ovary were removed on the side and the IP was ligated with a free tie followed by suture ligature. Inspection of all pedicles revealed adequate hemostasis.  The peritoneum was then closed with a running pursestring suture of 0 Vicryl. Vagina is irrigated. Anterior colporrhaphy is performed.  Allis clamps are placed along the anterior vaginal wall, lidocaine is used to infiltrate the plane, and incision is made midline vertical.  Endopelvic fascia is dissected free of vaginal mucosa.  Fascia is plicated w interrupted vicryl sutures.  Excess mucosa is excised.  Vaginal incision is closed with a running locking Vicryl suture, to incoprate the hysterectomy incision as well, with care taken to incorporate the uterosacral pedicles. Excellent hemostasis was noted at the end of the case. The vaginal cuff was inspected there was minimal bleeding noted.  Packing gauze w Premarin cream is placed. A Foley catheter is left in  place inside her bladder. Clear, yellow urine was noted. All instrument needle and lap counts were correct x 2. Patient was awakened taken to recovery room in stable condition.  Hoyt Koch, MD 04/24/2019, 10:44 AM

## 2019-04-24 NOTE — Interval H&P Note (Signed)
History and Physical Interval Note:  04/24/2019 8:41 AM  Olivia Richards  has presented today for surgery, with the diagnosis of Uterine prolapse N81.4 Cystocele, midline N81.11.  The various methods of treatment have been discussed with the patient and family. After consideration of risks, benefits and other options for treatment, the patient has consented to  Procedure(s): HYSTERECTOMY VAGINAL/ BSO (N/A) ANTERIOR REPAIR (CYSTOCELE) (N/A) CYSTOSCOPY (N/A) as a surgical intervention.  The patient's history has been reviewed, patient examined, no change in status, stable for surgery.  I have reviewed the patient's chart and labs.  Questions were answered to the patient's satisfaction.     Hoyt Koch

## 2019-04-24 NOTE — Transfer of Care (Signed)
Immediate Anesthesia Transfer of Care Note  Patient: Olivia Richards  Procedure(s) Performed: Procedure(s): HYSTERECTOMY VAGINAL/ BSO (N/A) ANTERIOR REPAIR (CYSTOCELE) (N/A) CYSTOSCOPY (N/A)  Patient Location: PACU  Anesthesia Type:General  Level of Consciousness: sedated  Airway & Oxygen Therapy: Patient Spontanous Breathing and Patient connected to face mask oxygen  Post-op Assessment: Report given to RN and Post -op Vital signs reviewed and stable  Post vital signs: Reviewed and stable  Last Vitals:  Vitals:   04/24/19 0751 04/24/19 1046  BP: (!) 153/77 125/66  Pulse: 82 81  Resp: 16 18  Temp: (!) 36.4 C 36.4 C  SpO2: [id number]    Complications: No apparent anesthesia complications

## 2019-04-24 NOTE — Telephone Encounter (Signed)
Patient is calling to find out about her antibiotic? She went to the pharmacy and it was not there. Please advise

## 2019-04-24 NOTE — Anesthesia Preprocedure Evaluation (Addendum)
Anesthesia Evaluation  Patient identified by MRN, date of birth, ID band Patient awake    Reviewed: Allergy & Precautions, H&P , NPO status , reviewed documented beta blocker date and time   Airway Mallampati: II  TM Distance: >3 FB Neck ROM: full    Dental  (+) Dental Advidsory Given, Caps States recent root canal, "sensitive teeth":   Pulmonary neg pulmonary ROS,    Pulmonary exam normal        Cardiovascular negative cardio ROS Normal cardiovascular exam     Neuro/Psych negative neurological ROS  negative psych ROS   GI/Hepatic negative GI ROS, Neg liver ROS, neg GERD  ,  Endo/Other  negative endocrine ROS  Renal/GU      Musculoskeletal  (+) Arthritis ,   Abdominal   Peds  Hematology negative hematology ROS (+)   Anesthesia Other Findings Past Medical History: No date: DDD (degenerative disc disease), cervical 2017: H/O colonoscopy No date: History of kidney stones No date: Low blood pressure Past Surgical History: POLYP: CERVIX SURGERY 2013: COLONOSCOPY     Comment:  Dr Gustavo Lah- every 5 yrs No date: FOOT SURGERY; Left No date: lithotripsy BMI    Body Mass Index: 29.01 kg/m     Reproductive/Obstetrics                            Anesthesia Physical Anesthesia Plan  ASA: II  Anesthesia Plan: General   Post-op Pain Management:    Induction: Intravenous  PONV Risk Score and Plan: 4 or greater and Ondansetron, Treatment may vary due to age or medical condition, Midazolam, Dexamethasone and Promethazine  Airway Management Planned: Oral ETT  Additional Equipment:   Intra-op Plan:   Post-operative Plan: Extubation in OR  Informed Consent: I have reviewed the patients History and Physical, chart, labs and discussed the procedure including the risks, benefits and alternatives for the proposed anesthesia with the patient or authorized representative who has indicated  his/her understanding and acceptance.     Dental Advisory Given  Plan Discussed with: CRNA  Anesthesia Plan Comments:       Anesthesia Quick Evaluation

## 2019-04-25 NOTE — Telephone Encounter (Signed)
Pt states she is doing well . Has a little bit of discomfort, but is very grateful

## 2019-04-25 NOTE — Telephone Encounter (Signed)
Call and see how she is doing after her hysterectomy yesterday I have already made her aware no antibiotic was needed

## 2019-04-26 NOTE — Anesthesia Postprocedure Evaluation (Signed)
Anesthesia Post Note  Patient: Olivia Richards  Procedure(s) Performed: HYSTERECTOMY VAGINAL/ BSO (N/A ) ANTERIOR REPAIR (CYSTOCELE) (N/A )  Patient location during evaluation: PACU Anesthesia Type: General Level of consciousness: awake and alert Pain management: pain level controlled Vital Signs Assessment: post-procedure vital signs reviewed and stable Respiratory status: spontaneous breathing, nonlabored ventilation and respiratory function stable Cardiovascular status: blood pressure returned to baseline and stable Postop Assessment: no apparent nausea or vomiting Anesthetic complications: no     Last Vitals:  Vitals:   04/24/19 1252 04/24/19 1339  BP: (!) 104/56 121/72  Pulse: 79 83  Resp: 16 18  Temp:    SpO2: 96% 94%    Last Pain:  Vitals:   04/25/19 1020  TempSrc:   PainSc: 6                  Jerre Vandrunen Harvie Heck

## 2019-04-28 LAB — SURGICAL PATHOLOGY

## 2019-05-07 ENCOUNTER — Encounter: Payer: Self-pay | Admitting: Obstetrics & Gynecology

## 2019-05-07 ENCOUNTER — Ambulatory Visit (INDEPENDENT_AMBULATORY_CARE_PROVIDER_SITE_OTHER): Payer: Medicare PPO | Admitting: Obstetrics & Gynecology

## 2019-05-07 ENCOUNTER — Other Ambulatory Visit: Payer: Self-pay

## 2019-05-07 VITALS — BP 138/80 | Ht 65.5 in | Wt 173.0 lb

## 2019-05-07 DIAGNOSIS — Z48816 Encounter for surgical aftercare following surgery on the genitourinary system: Secondary | ICD-10-CM

## 2019-05-07 DIAGNOSIS — Z90710 Acquired absence of both cervix and uterus: Secondary | ICD-10-CM

## 2019-05-07 NOTE — Progress Notes (Signed)
  Postoperative Follow-up Patient presents post op from vaginal hysterectomy, anterior colporrhaphy and BSO for pelvic pain and pelvic relaxation, 2 weeks ago. Path: DIAGNOSIS:  A. UTERUS WITH CERVIX AND BILATERAL FALLOPIAN TUBES AND OVARIES; TOTAL  HYSTERECTOMY WITH BILATERAL SALPINGO-OOPHORECTOMY:  - BENIGN ENDOCERVICAL POLYP MEASURING 0.7 CM.  - CHRONIC CERVICITIS WITH SQUAMOUS METAPLASIA AND CHANGES CONSISTENT  WITH PROLAPSE.  - ATROPHIC ENDOMETRIUM WITH FOCAL ENDOMETRIAL HYPERPLASIA; NEGATIVE FOR  ATYPIA / EIN.  - MYOMETRIUM WITH ADENOMYOSIS AND LEIOMYOMATA, LARGEST MEASURING 0.7 CM.  - BILATERAL OVARIES WITH BENIGN SIMPLE CYSTS.  - BILATERAL FALLOPIAN TUBES WITH FIMBRIATED ENDS AND BENIGN PARATUBAL  CYSTS.  - NEGATIVE FOR ATYPIA AND MALIGNANCY.  Subjective: Patient reports marked improvement in her preop symptoms. Eating a regular diet without difficulty. The patient is not having any pain.  Activity: normal activities of daily living. Patient reports additional symptom's since surgery of No bleeding.  Objective: There were no vitals taken for this visit. Physical Exam Constitutional:      General: She is not in acute distress.    Appearance: She is well-developed.  Musculoskeletal:        General: Normal range of motion.  Neurological:     Mental Status: She is alert and oriented to person, place, and time.  Skin:    General: Skin is warm and dry.  Vitals reviewed.    Assessment: s/p :  vaginal hysterectomy, anterior colporrhaphy and BSO progressing well  Plan: Patient has done well after surgery with no apparent complications.  I have discussed the post-operative course to date, and the expected progress moving forward.  The patient understands what complications to be concerned about.  I will see the patient in routine follow up, or sooner if needed.    Activity plan: No heavy lifting.Marland Kitchen  Pelvic rest.  Hoyt Koch 05/07/2019, 10:22 AM

## 2019-06-04 ENCOUNTER — Encounter: Payer: Self-pay | Admitting: Obstetrics & Gynecology

## 2019-06-04 ENCOUNTER — Ambulatory Visit (INDEPENDENT_AMBULATORY_CARE_PROVIDER_SITE_OTHER): Payer: Medicare PPO | Admitting: Obstetrics & Gynecology

## 2019-06-04 ENCOUNTER — Other Ambulatory Visit: Payer: Self-pay

## 2019-06-04 VITALS — BP 120/80 | Ht 65.0 in | Wt 176.0 lb

## 2019-06-04 DIAGNOSIS — Z90710 Acquired absence of both cervix and uterus: Secondary | ICD-10-CM

## 2019-06-04 DIAGNOSIS — Z48816 Encounter for surgical aftercare following surgery on the genitourinary system: Secondary | ICD-10-CM

## 2019-06-04 DIAGNOSIS — Z13820 Encounter for screening for osteoporosis: Secondary | ICD-10-CM

## 2019-06-04 NOTE — Progress Notes (Signed)
  Postoperative Follow-up Patient presents post op from vaginal hysterectomy for pelvic relaxation, 6 weeks ago.  Subjective: Patient reports marked improvement in her preop symptoms. Eating a regular diet without difficulty. The patient is not having any pain.  Activity: normal activities of daily living. Patient reports additional symptom's since surgery of None.  Objective: BP 120/80   Ht 5\' 5"  (1.651 m)   Wt 176 lb (79.8 kg)   BMI 29.29 kg/m  Physical Exam Constitutional:      General: She is not in acute distress.    Appearance: She is well-developed.  Genitourinary:     Pelvic exam was performed with patient supine.     Vagina and rectum normal.     No vaginal erythema or bleeding.     No right or left adnexal mass present.     Right adnexa not tender.     Left adnexa not tender.     Genitourinary Comments: Cervix and uterus absent. Vaginal cuff healing well.  Cardiovascular:     Rate and Rhythm: Normal rate.  Pulmonary:     Effort: Pulmonary effort is normal.  Abdominal:     General: There is no distension.     Palpations: Abdomen is soft.     Tenderness: There is no abdominal tenderness.     Comments: Incision healing well.  Musculoskeletal:        General: Normal range of motion.  Neurological:     Mental Status: She is alert and oriented to person, place, and time.     Cranial Nerves: No cranial nerve deficit.  Skin:    General: Skin is warm and dry.     Assessment: s/p :  vaginal hysterectomy progressing well  Plan: Patient has done well after surgery with no apparent complications.  I have discussed the post-operative course to date, and the expected progress moving forward.  The patient understands what complications to be concerned about.  I will see the patient in routine follow up, or sooner if needed.    Activity plan: No restriction. [place]egels  DEXA due, ordered  Hoyt Koch 06/04/2019, 10:12 AM

## 2019-06-30 ENCOUNTER — Ambulatory Visit: Payer: Medicare PPO | Admitting: Family Medicine

## 2019-06-30 ENCOUNTER — Other Ambulatory Visit: Payer: Self-pay

## 2019-06-30 ENCOUNTER — Encounter: Payer: Self-pay | Admitting: Family Medicine

## 2019-06-30 VITALS — BP 140/80 | HR 92 | Ht 65.0 in | Wt 177.0 lb

## 2019-06-30 DIAGNOSIS — S29011A Strain of muscle and tendon of front wall of thorax, initial encounter: Secondary | ICD-10-CM | POA: Diagnosis not present

## 2019-06-30 MED ORDER — MELOXICAM 7.5 MG PO TABS: 7.5000 mg | ORAL_TABLET | Freq: Every day | ORAL | 0 refills | Status: DC

## 2019-06-30 NOTE — Patient Instructions (Signed)

## 2019-06-30 NOTE — Progress Notes (Signed)
Date:  06/30/2019   Name:  Olivia Richards   DOB:  [birth year]   MRN:  [id number]   Chief Complaint: Chest Pain (R) rib pain- had a hysterectomy the end of March)  Chest Pain  This is a new problem. The current episode started 1 to 4 weeks ago (2 weeks). The onset quality is sudden. The problem occurs intermittently. The problem has been unchanged. The pain is present in the lateral region. The pain is at a severity of 7/10. The pain is moderate. Quality: ache. Pertinent negatives include no abdominal pain, back pain, cough, diaphoresis, dizziness, fever, headaches, hemoptysis, lower extremity edema, nausea, numbness, orthopnea, palpitations, PND, shortness of breath, vomiting or weakness. She has tried acetaminophen for the symptoms. The treatment provided moderate relief. Risk factors: recent vaginal hysterectomy.    Lab Results  Component Value Date   CREATININE 0.89 04/22/2019   BUN 16 04/22/2019   NA 138 04/22/2019   K 4.2 04/22/2019   CL 105 04/22/2019   CO2 24 04/22/2019   Lab Results  Component Value Date   CHOL 225 (H) 11/20/2018   HDL 60 11/20/2018   LDLCALC 145 (H) 11/20/2018   TRIG 115 11/20/2018   CHOLHDL 3.6 01/25/2018   No results found for: TSH No results found for: HGBA1C Lab Results  Component Value Date   WBC 7.6 04/22/2019   HGB 14.0 04/22/2019   HCT 43.0 04/22/2019   MCV 80.8 04/22/2019   PLT 314 04/22/2019   Lab Results  Component Value Date   ALT 27 04/22/2019   AST 22 04/22/2019   ALKPHOS 89 04/22/2019   BILITOT 0.9 04/22/2019     Review of Systems  Constitutional: Negative.  Negative for chills, diaphoresis, fatigue, fever and unexpected weight change.  HENT: Negative for congestion, ear discharge, ear pain, rhinorrhea, sinus pressure, sneezing and sore throat.   Eyes: Negative for photophobia, pain, discharge, redness and itching.  Respiratory: Negative for cough, hemoptysis, shortness of breath, wheezing and stridor.    Cardiovascular: Positive for chest pain. Negative for palpitations, orthopnea and PND.  Gastrointestinal: Negative for abdominal pain, blood in stool, constipation, diarrhea, nausea and vomiting.  Endocrine: Negative for cold intolerance, heat intolerance, polydipsia, polyphagia and polyuria.  Genitourinary: Negative for dysuria, flank pain, frequency, hematuria, menstrual problem, pelvic pain, urgency, vaginal bleeding and vaginal discharge.  Musculoskeletal: Negative for arthralgias, back pain and myalgias.  Skin: Negative for rash.  Allergic/Immunologic: Negative for environmental allergies and food allergies.  Neurological: Negative for dizziness, weakness, light-headedness, numbness and headaches.  Hematological: Negative for adenopathy. Does not bruise/bleed easily.  Psychiatric/Behavioral: Negative for dysphoric mood. The patient is not nervous/anxious.     Patient Active Problem List   Diagnosis Date Noted  . S/P vaginal hysterectomy 05/07/2019  . Uterine prolapse 03/18/2019  . Dysuria 06/12/2018  . Vulvar candidiasis 06/12/2018    Allergies  Allergen Reactions  . Levaquin [Levofloxacin In D5w] Itching and Other (See Comments)    blisters  . Onion Other (See Comments)    Diarrhea and vomiting    Past Surgical History:  Procedure Laterality Date  . CERVIX SURGERY  POLYP  . COLONOSCOPY  2013   Dr Gustavo Lah- every 5 yrs  . CYSTOCELE REPAIR N/A 04/24/2019   Procedure: ANTERIOR REPAIR (CYSTOCELE);  Surgeon: Gae Dry, MD;  Location: [facility];  Service: Gynecology;  Laterality: N/A;  . FOOT SURGERY Left   . lithotripsy    . VAGINAL HYSTERECTOMY N/A 04/24/2019   Procedure: HYSTERECTOMY VAGINAL/  BSO;  Surgeon: Gae Dry, MD;  Location: [facility] OR[facility];  Service: Gynecology;  Laterality: N/A;    Social History   Tobacco Use  . Smoking status: Never Smoker  . Smokeless tobacco: Never Used  Substance Use Topics  . Alcohol use: No  . Drug use: No     Medication  list has been reviewed and updated.  Current Meds  Medication Sig  . acetaminophen (TYLENOL) 500 MG tablet Take 500-1,000 mg by mouth every 6 (six) hours as needed (for pain).    PHQ 2/9 Scores 06/30/2019 06/10/2015 02/19/2015  PHQ - 2 Score 0 0 1  PHQ- 9 Score 0 - -    BP Readings from Last 3 Encounters:  06/30/19 140/80  06/04/19 120/80  05/07/19 138/80    Physical Exam Vitals and nursing note reviewed.  Constitutional:      Appearance: She is well-developed.  HENT:     Head: Normocephalic.     Right Ear: External ear normal.     Left Ear: External ear normal.  Eyes:     General: Lids are everted, no foreign bodies appreciated. No scleral icterus.       Left eye: No foreign body or hordeolum.     Extraocular Movements: Extraocular movements intact.     Conjunctiva/sclera: Conjunctivae normal.     Right eye: Right conjunctiva is not injected.     Left eye: Left conjunctiva is not injected.     Pupils: Pupils are equal, round, and reactive to light.  Neck:     Thyroid: No thyromegaly.     Vascular: No hepatojugular reflux or JVD.     Trachea: No tracheal deviation.  Cardiovascular:     Rate and Rhythm: Normal rate and regular rhythm.     Heart sounds: Normal heart sounds. No murmur. No friction rub. No gallop.   Pulmonary:     Effort: Pulmonary effort is normal. No respiratory distress.     Breath sounds: Normal breath sounds. No stridor. No decreased breath sounds, wheezing, rhonchi or rales.     Comments: No rub/positive [place] Chest:     Chest wall: Tenderness present. No deformity or crepitus.    Abdominal:     General: Bowel sounds are normal.     Palpations: Abdomen is soft. There is no mass.     Tenderness: There is no abdominal tenderness. There is no guarding or rebound.  Musculoskeletal:        General: No tenderness. Normal range of motion.     Cervical back: Normal range of motion and neck supple.  Lymphadenopathy:     Cervical: No cervical  adenopathy.  Skin:    General: Skin is warm.     Capillary Refill: Capillary refill takes less than 2 seconds.     Findings: No rash.  Neurological:     General: No focal deficit present.     Mental Status: She is alert and oriented to person, place, and time.     Cranial Nerves: No cranial nerve deficit.     Deep Tendon Reflexes: Reflexes normal.  Psychiatric:        Mood and Affect: Mood is not anxious or depressed.     Wt Readings from Last 3 Encounters:  06/30/19 177 lb (80.3 kg)  06/04/19 176 lb (79.8 kg)  05/07/19 173 lb (78.5 kg)    BP 140/80   Pulse 92   Ht 5\' 5"  (1.651 m)   Wt 177 lb (80.3 kg)   BMI  29.45 kg/m   Assessment and Plan: 1. Intercostal muscle strain, initial encounter Acute.  Persistent.  Uncontrolled.  Relatively stable.  Patient has acute onset after aggressive painting a deck and other manual activities developed a sharp pain ache that is persistent in the right anterior lateral radiating to the anterolateral aspect of the 10th intercostal space may be 9 intercostal space and associated ribs.  We will initiate meloxicam 7.5 mg once a day.  Patient will call or go to the ER if unresolved. - meloxicam (MOBIC) 7.5 MG tablet; Take 1 tablet (7.5 mg total) by mouth daily.  Dispense: 30 tablet; Refill: 0

## 2019-09-12 DIAGNOSIS — Z872 Personal history of diseases of the skin and subcutaneous tissue: Secondary | ICD-10-CM | POA: Diagnosis not present

## 2019-09-12 DIAGNOSIS — L578 Other skin changes due to chronic exposure to nonionizing radiation: Secondary | ICD-10-CM | POA: Diagnosis not present

## 2019-09-18 ENCOUNTER — Telehealth: Payer: Self-pay

## 2019-09-18 NOTE — Telephone Encounter (Signed)
Pt aware. Says she was trying to trim pubic hair.

## 2019-09-18 NOTE — Telephone Encounter (Signed)
Pt left msg on triage line saying she has a cut in pubic area. What can we advise she can buy OTC to help it stay clean? Called her back to get more details (maybe she has more symptoms going on), no answer, LVMTRC. Per [facility]G if its just a cut she can keep it clean with soap and water and can also try dermoplast spray.

## 2019-10-13 DIAGNOSIS — M67471 Ganglion, right ankle and foot: Secondary | ICD-10-CM | POA: Diagnosis not present

## 2019-10-13 DIAGNOSIS — M79671 Pain in right foot: Secondary | ICD-10-CM | POA: Diagnosis not present

## 2019-12-07 ENCOUNTER — Other Ambulatory Visit: Payer: Self-pay | Admitting: Family Medicine

## 2019-12-07 DIAGNOSIS — S29011A Strain of muscle and tendon of front wall of thorax, initial encounter: Secondary | ICD-10-CM

## 2019-12-07 NOTE — Telephone Encounter (Signed)
Requested medication (s) are due for refill today: yes  Requested medication (s) are on the active medication list: yes  Last refill:  06/30/19  Future visit scheduled: no  Notes to clinic:  please review if pt needs to have appt. For reevaluation    Requested Prescriptions  Pending Prescriptions Disp Refills   meloxicam (MOBIC) 7.5 MG tablet [Pharmacy Med Name: MELOXICAM 7.5 MG TABLET] 30 tablet 0    Sig: TAKE 1 TABLET BY MOUTH EVERY DAY      Analgesics:  COX2 Inhibitors Passed - 12/07/2019 10:43 AM      Passed - HGB in normal range and within 360 days    Hemoglobin  Date Value Ref Range Status  04/22/2019 14.0 12.0 - 15.0 g/dL Final          Passed - Cr in normal range and within 360 days    Creatinine, Ser  Date Value Ref Range Status  04/22/2019 0.89 0.44 - 1.00 mg/dL Final          Passed - Patient is not pregnant      Passed - Valid encounter within last 12 months    Recent Outpatient Visits           5 months ago Intercostal muscle strain, initial encounter   [facility] Juline Patch, MD   11 months ago Cervical disc disease   [facility] Juline Patch, MD   1 year ago Healthcare maintenance   [facility] Juline Patch, MD   3 years ago Pneumonia due to infectious organism, unspecified laterality, unspecified part of lung   [facility] Juline Patch, MD   4 years ago Cystitis   [facility] Juline Patch, MD

## 2019-12-15 ENCOUNTER — Telehealth: Payer: Self-pay

## 2019-12-15 ENCOUNTER — Other Ambulatory Visit: Payer: Self-pay | Admitting: Obstetrics & Gynecology

## 2019-12-15 MED ORDER — CLOTRIMAZOLE-BETAMETHASONE 1-0.05 % EX CREA: 1.0000 "application " | TOPICAL_CREAM | Freq: Two times a day (BID) | CUTANEOUS | 0 refills | Status: DC

## 2019-12-15 NOTE — Telephone Encounter (Signed)
Pt left msg on triage for [facility].Says she has itchiness going on in "crotch area". Called her back to get more info, no answer, LVMTRC.

## 2019-12-15 NOTE — Telephone Encounter (Signed)
Lotrisone Rx sent.  If persists, then sch appt

## 2019-12-15 NOTE — Telephone Encounter (Signed)
Pt called back. Says she feels like she sweats a lot down there. Has some itchy bumps and feels swelling. Denies excessive discharge, odor. Says "lotrisone" has helped her before. She would like to know if something can be called in or does she needs to make an appt.

## 2019-12-15 NOTE — Telephone Encounter (Signed)
Pt aware.

## 2019-12-24 ENCOUNTER — Telehealth: Payer: Self-pay

## 2019-12-24 ENCOUNTER — Encounter: Payer: Self-pay | Admitting: Obstetrics & Gynecology

## 2019-12-24 DIAGNOSIS — Z1231 Encounter for screening mammogram for malignant neoplasm of breast: Secondary | ICD-10-CM | POA: Diagnosis not present

## 2019-12-24 NOTE — Telephone Encounter (Signed)
Pt calling; how long should she use cream; has finished one tube; rash is better but still feels swollen.  [phone]  Pt was given two 15g tubes; 30g tube rx'd; adv pt to finish the second tube and if still has issues to call to be seen or if worsens while using the second tube to call to be seen.

## 2019-12-29 NOTE — Telephone Encounter (Signed)
Patient calling to f/u on previous message. She has finished the cream for itching. It is better, she still has a little bit of an issue that she would like to discuss to see if she should use something OTC or schedule an apt for f/u w/[facility]. [phone]

## 2019-12-30 NOTE — Telephone Encounter (Signed)
Spoke w/patient. She didn't finish the cream. She applied it last Thursday evening and it was burning so bad she had to get in the shower to wash it off. The bumps are gone. States the opening and just inside the vagina feels dry. It doesn't itch, no d/c, just feels funny.

## 2019-12-30 NOTE — Telephone Encounter (Signed)
Patient is 12/31/19 at 4:10 with [facility]

## 2019-12-30 NOTE — Telephone Encounter (Signed)
Per my last note, if not better then sch appt

## 2019-12-31 ENCOUNTER — Other Ambulatory Visit: Payer: Self-pay

## 2019-12-31 ENCOUNTER — Encounter: Payer: Self-pay | Admitting: Obstetrics & Gynecology

## 2019-12-31 ENCOUNTER — Ambulatory Visit (INDEPENDENT_AMBULATORY_CARE_PROVIDER_SITE_OTHER): Payer: Medicare PPO | Admitting: Obstetrics & Gynecology

## 2019-12-31 VITALS — BP 120/80 | Ht 65.5 in | Wt 176.0 lb

## 2019-12-31 DIAGNOSIS — N952 Postmenopausal atrophic vaginitis: Secondary | ICD-10-CM

## 2019-12-31 DIAGNOSIS — Z13820 Encounter for screening for osteoporosis: Secondary | ICD-10-CM | POA: Diagnosis not present

## 2019-12-31 MED ORDER — ESTROGENS, CONJUGATED 0.625 MG/GM VA CREA: TOPICAL_CREAM | VAGINAL | 11 refills | Status: DC

## 2019-12-31 NOTE — Patient Instructions (Signed)
Conjugated Estrogens vaginal cream What is this medicine? CONJUGATED ESTROGENS (CON ju gate ed ESS troe jenz) are a mixture of female hormones. This cream can help relieve symptoms associated with menopause.like vaginal dryness and irritation. This medicine may be used for other purposes; ask your health care provider or pharmacist if you have questions. COMMON BRAND NAME(S): Premarin What should I tell my health care provider before I take this medicine? They need to know if you have any of these conditions:  abnormal vaginal bleeding  blood vessel disease or blood clots  breast, cervical, endometrial, or uterine cancer  dementia  diabetes  gallbladder disease  heart disease or recent heart attack  high blood pressure  high cholesterol  high level of calcium in the blood  hysterectomy  kidney disease  liver disease  migraine headaches  protein C deficiency  protein S deficiency  stroke  systemic lupus erythematosus (SLE)  tobacco smoker  an unusual or allergic reaction to estrogens other medicines, foods, dyes, or preservatives  pregnant or trying to get pregnant  breast-feeding How should I use this medicine? This medicine is for use in the vagina only. Do not take by mouth. Follow the directions on the prescription label. Use at bedtime unless otherwise directed by your doctor or health care professional. Use the special applicator supplied with the cream. Wash hands before and after use. Fill the applicator with the cream and remove from the tube. Lie on your back, part and bend your knees. Insert the applicator into the vagina and push the plunger to expel the cream into the vagina. Wash the applicator with warm soapy water and rinse well. Use exactly as directed for the complete length of time prescribed. Do not stop using except on the advice of your doctor or health care professional. Talk to your pediatrician regarding the use of this medicine in  children. Special care may be needed. A patient package insert for the product will be given with each prescription and refill. Read this sheet carefully each time. The sheet may change frequently. Overdosage: If you think you have taken too much of this medicine contact a poison control center or emergency room at once. NOTE: This medicine is only for you. Do not share this medicine with others. What if I miss a dose? If you miss a dose, use it as soon as you can. If it is almost time for your next dose, use only that dose. Do not use double or extra doses. What may interact with this medicine? Do not take this medicine with any of the following medications:  aromatase inhibitors like aminoglutethimide, anastrozole, exemestane, letrozole, testolactone This medicine may also interact with the following medications:  barbiturates used for inducing sleep or treating seizures  carbamazepine  grapefruit juice  medicines for fungal infections like itraconazole and ketoconazole  raloxifene or tamoxifen  rifabutin  rifampin  rifapentine  ritonavir  some antibiotics used to treat infections  St. John's Wort  warfarin This list may not describe all possible interactions. Give your health care provider a list of all the medicines, herbs, non-prescription drugs, or dietary supplements you use. Also tell them if you smoke, drink alcohol, or use illegal drugs. Some items may interact with your medicine. What should I watch for while using this medicine? Visit your health care professional for regular checks on your progress. You will need a regular breast and pelvic exam. You should also discuss the need for regular mammograms with your health care professional, and  follow his or her guidelines. This medicine can make your body retain fluid, making your fingers, hands, or ankles swell. Your blood pressure can go up. Contact your doctor or health care professional if you feel you are  retaining fluid. If you have any reason to think you are pregnant; stop taking this medicine at once and contact your doctor or health care professional. Tobacco smoking increases the risk of getting a blood clot or having a stroke, especially if you are more than 65 years old. You are strongly advised not to smoke. If you wear contact lenses and notice visual changes, or if the lenses begin to feel uncomfortable, consult your eye care specialist. If you are going to have elective surgery, you may need to stop taking this medicine beforehand. Consult your health care professional for advice prior to scheduling the surgery. What side effects may I notice from receiving this medicine? Side effects that you should report to your doctor or health care professional as soon as possible:  allergic reactions like skin rash, itching or hives, swelling of the face, lips, or tongue  breast tissue changes or discharge  changes in vision  chest pain  confusion, trouble speaking or understanding  dark urine  general ill feeling or flu-like symptoms  light-colored stools  nausea, vomiting  pain, swelling, warmth in the leg  right upper belly pain  severe headaches  shortness of breath  sudden numbness or weakness of the face, arm or leg  trouble walking, dizziness, loss of balance or coordination  unusual vaginal bleeding  yellowing of the eyes or skin Side effects that usually do not require medical attention (report to your doctor or health care professional if they continue or are bothersome):  hair loss  increased hunger or thirst  increased urination  symptoms of vaginal infection like itching, irritation or unusual discharge  unusually weak or tired This list may not describe all possible side effects. Call your doctor for medical advice about side effects. You may report side effects to FDA at 1-800-FDA-1088. Where should I keep my medicine? Keep out of the reach of  children. Store at room temperature between 15 and 30 degrees C (59 and 86 degrees F). Throw away any unused medicine after the expiration date. NOTE: This sheet is a summary. It may not cover all possible information. If you have questions about this medicine, talk to your doctor, pharmacist, or health care provider.  2020 Elsevier/Gold Standard (2010-05-11 09:20:36)

## 2019-12-31 NOTE — Progress Notes (Signed)
  HPI:      [name] is a 64 y.o. [id number] who LMP was No LMP recorded. Patient is postmenopausal., presents today for a problem visit.  She complains XI:[id number] concerns w sweat in the vulvar area and this is a long standing problem but recently w rash and itching and bumps.  She used Lotrisone that seemed to help.  Still has vaginal "tightening" and irritation.  Not sexually active.  No bleeding or d/c.  TVH last year.  PMHx: She  has a past medical history of DDD (degenerative disc disease), cervical, H/O colonoscopy (2017), History of kidney stones, and Low blood pressure. Also,  has a past surgical history that includes Foot surgery (Left); Cervix surgery (POLYP); Colonoscopy (2013); lithotripsy; Vaginal hysterectomy (N/A, 04/24/2019); and Cystocele repair (N/A, 04/24/2019)., family history includes Breast cancer in her mother; Heart disease in her father.,  reports that she has never smoked. She has never used smokeless tobacco. She reports that she does not drink alcohol and does not use drugs.  She has a current medication list which includes the following prescription(s): acetaminophen, clotrimazole-betamethasone, conjugated estrogens, and meloxicam. Also, is allergic to levaquin [levofloxacin in d5w] and onion.  Review of Systems  All other systems reviewed and are negative.   Objective: BP 120/80   Ht 5' 5.5" (1.664 m)   Wt 176 lb (79.8 kg)   BMI 28.84 kg/m  Physical Exam Constitutional:      General: She is not in acute distress.    Appearance: She is well-developed.  Genitourinary:     Pelvic exam was performed with patient supine.     Vagina normal.     No vaginal erythema or bleeding.     Genitourinary Comments: Cuff intact/ no lesions  Absent uterus and cervix  HENT:     Head: Normocephalic and atraumatic.     Nose: Nose normal.  Abdominal:     General: There is no distension.     Palpations: Abdomen is soft.     Tenderness: There is no abdominal  tenderness.  Musculoskeletal:        General: Normal range of motion.  Neurological:     Mental Status: She is alert and oriented to person, place, and time.     Cranial Nerves: No cranial nerve deficit.  Skin:    General: Skin is warm and dry.  Psychiatric:        Attention and Perception: Attention normal.        Mood and Affect: Mood normal.        Speech: Speech normal.        Behavior: Behavior normal.        Cognition and Memory: Cognition normal.        Judgment: Judgment normal.     ASSESSMENT/PLAN:    Problem List Items Addressed This Visit    Vaginal atrophy    -  Primary   Screening for osteoporosis        DEXA needs to be scheduled  Discussed pros and cons of vag estrogen therapy as an option for her sx's related to vag atrophy.  Premarin vaginal cream at low dose and twice weekly planned.  Monitor sx's and adjust dosing as needed.  [name], MD, [facility], [facility] 12/31/2019  4:35 PM

## 2020-01-05 ENCOUNTER — Telehealth: Payer: Self-pay | Admitting: Obstetrics & Gynecology

## 2020-01-05 NOTE — Telephone Encounter (Signed)
Called pt to adv that DEXA appt is scheduled for [facility] 12/13. She has a conflict so I provided the # [phone] for her to reschedule.

## 2020-02-02 ENCOUNTER — Other Ambulatory Visit: Payer: Medicare PPO

## 2020-02-09 ENCOUNTER — Inpatient Hospital Stay: Admission: RE | Admit: 2020-02-09 | Payer: Medicare PPO | Source: Ambulatory Visit

## 2020-02-13 ENCOUNTER — Other Ambulatory Visit: Payer: Self-pay

## 2020-02-13 ENCOUNTER — Ambulatory Visit
Admission: EM | Admit: 2020-02-13 | Discharge: 2020-02-13 | Disposition: A | Discharge status: Home / Self Care | Payer: Medicare PPO | Attending: Sports Medicine | Admitting: Sports Medicine

## 2020-02-13 ENCOUNTER — Encounter: Payer: Self-pay | Admitting: Emergency Medicine

## 2020-02-13 DIAGNOSIS — R0982 Postnasal drip: Secondary | ICD-10-CM | POA: Insufficient documentation

## 2020-02-13 DIAGNOSIS — R059 Cough, unspecified: Secondary | ICD-10-CM | POA: Diagnosis not present

## 2020-02-13 DIAGNOSIS — J069 Acute upper respiratory infection, unspecified: Secondary | ICD-10-CM | POA: Insufficient documentation

## 2020-02-13 DIAGNOSIS — Z20822 Contact with and (suspected) exposure to covid-19: Secondary | ICD-10-CM | POA: Diagnosis not present

## 2020-02-13 LAB — RESP PANEL BY RT-PCR (FLU A&B, COVID) ARPGX2
Influenza A by PCR: NEGATIVE
Influenza B by PCR: NEGATIVE
SARS Coronavirus 2 by RT PCR: NEGATIVE

## 2020-02-13 NOTE — Discharge Instructions (Addendum)
I just recommended supportive care.  Plenty of fluids, plenty of rest.  If she is around her 65 year old mother she should practice social distancing when appropriate as well as use of the mask handwashing and all Covid protocol precautions. Over-the-counter meds as needed. Welcome to see [place] in the future if things were to progress but for now we will discharge her from care.

## 2020-02-13 NOTE — ED Triage Notes (Signed)
Patient in today c/o dry cough and runny nose x today. Patient denies fever. Patient has not taken any OTC medications. Patient has had the covid vaccines. Patient requesting covid test today due to being around her 65 year old mother for the holidays.

## 2020-02-13 NOTE — ED Provider Notes (Signed)
[facility]    CSN: [id number] Arrival date & time: 02/13/20  [id number]      History   Chief Complaint Chief Complaint  Patient presents with  . Nasal Congestion  . Cough    HPI Olivia Richards is a 64 y.o. female.   Patient is a pleasant 64 year old female who presents for evaluation of the above complaints.  She says that she woke up today had a little bit of nasal congestion small amount of cough.  Her main concern is that she has a 53 year old mother that she wants to be around for Christmas and does not want to transmit Covid or influenza to her.  She denies fever shakes chills, shortness of breath, or chest pain.  She does report a little bit of postnasal drip and sneezing.  She has been around some grandkids that have had the sniffles and she is just a little concerned.  No red flag signs or symptoms offered.     Past Medical History:  Diagnosis Date  . DDD (degenerative disc disease), cervical   . H/O colonoscopy 2017  . History of kidney stones   . Low blood pressure     Patient Active Problem List   Diagnosis Date Noted  . S/P vaginal hysterectomy 05/07/2019  . Uterine prolapse 03/18/2019  . Dysuria 06/12/2018  . Vulvar candidiasis 06/12/2018    Past Surgical History:  Procedure Laterality Date  . CERVIX SURGERY  POLYP  . COLONOSCOPY  2013   Dr Gustavo Lah- every 5 yrs  . CYSTOCELE REPAIR N/A 04/24/2019   Procedure: ANTERIOR REPAIR (CYSTOCELE);  Surgeon: Gae Dry, MD;  Location: [facility];  Service: Gynecology;  Laterality: N/A;  . FOOT SURGERY Left   . lithotripsy    . VAGINAL HYSTERECTOMY N/A 04/24/2019   Procedure: HYSTERECTOMY VAGINAL/ BSO;  Surgeon: Gae Dry, MD;  Location: [facility];  Service: Gynecology;  Laterality: N/A;    OB History    Gravida  2   Para  2   Term  2   Preterm      AB      Living  2     SAB      IAB      Ectopic      Multiple      Live Births  2            Home Medications     Prior to Admission medications   Medication Sig Start Date End Date Taking? Authorizing Provider  acetaminophen (TYLENOL) 500 MG tablet Take 500-1,000 mg by mouth every 6 (six) hours as needed (for pain). Patient not taking: No sig reported    [provider]  clotrimazole-betamethasone (LOTRISONE) cream Apply 1 application topically 2 (two) times daily. Patient not taking: No sig reported 12/15/19   Gae Dry, MD  conjugated estrogens (PREMARIN) vaginal cream 1 gram vaginally every other night at bedtime for 2 weeks, then 1 gram vaginally twice weekly 12/31/19   Gae Dry, MD  meloxicam (MOBIC) 7.5 MG tablet TAKE 1 TABLET BY MOUTH EVERY DAY Patient not taking: No sig reported 12/08/19   Juline Patch, MD    Family History Family History  Problem Relation Age of Onset  . Breast cancer Mother   . COPD Mother   . Hyperlipidemia Mother   . Heart disease Father     Social History Social History   Tobacco Use  . Smoking status: Never Smoker  . Smokeless tobacco: Never Used  Vaping Use  . Vaping Use: Never used  Substance Use Topics  . Alcohol use: No  . Drug use: No     Allergies   Levaquin [levofloxacin in d5w] and Onion   Review of Systems Review of Systems  Constitutional: Negative for chills, diaphoresis, fatigue and fever.  HENT: Positive for congestion, postnasal drip and sneezing. Negative for ear pain and sore throat.   Respiratory: Positive for cough. Negative for apnea, choking, chest tightness, shortness of breath, wheezing and stridor.   Cardiovascular: Negative for chest pain.  Gastrointestinal: Negative for abdominal pain.  Genitourinary: Negative for dysuria.  Skin: Negative for color change, pallor, rash and wound.  Neurological: Negative for dizziness, light-headedness, numbness and headaches.     Physical Exam Triage Vital Signs ED Triage Vitals [02/13/20 0942]  Enc Vitals Group     BP (!) 157/80     Pulse Rate 76      Resp 18     Temp 97.9 F (36.6 C)     Temp Source Oral     SpO2 100 %     Weight 165 lb (74.8 kg)     Height 5' 5.5" (1.664 m)     Head Circumference      Peak Flow      Pain Score 0     Pain Loc      Pain Edu?      Excl. in [place]?    No data found.  Updated Vital Signs BP (!) 153/85 (BP Location: Right Arm)   Pulse 76   Temp 97.9 F (36.6 C) (Oral)   Resp 18   Ht 5' 5.5" (1.664 m)   Wt 74.8 kg   SpO2 100%   BMI 27.04 kg/m   Visual Acuity Right Eye Distance:   Left Eye Distance:   Bilateral Distance:    Right Eye Near:   Left Eye Near:    Bilateral Near:     Physical Exam Vitals and nursing note reviewed.  Constitutional:      General: She is not in acute distress.    Appearance: Normal appearance. She is not ill-appearing or toxic-appearing.  HENT:     Head: Normocephalic.     Nose: Congestion present. No rhinorrhea.     Mouth/Throat:     Mouth: Mucous membranes are moist.     Pharynx: Posterior oropharyngeal erythema present. No oropharyngeal exudate.  Eyes:     Extraocular Movements: Extraocular movements intact.     Conjunctiva/sclera: Conjunctivae normal.     Pupils: Pupils are equal, round, and reactive to light.  Neck:     Vascular: No carotid bruit.  Cardiovascular:     Rate and Rhythm: Normal rate and regular rhythm.     Pulses: Normal pulses.     Heart sounds: Normal heart sounds. No murmur heard. No friction rub. No gallop.   Pulmonary:     Effort: Pulmonary effort is normal. No respiratory distress.     Breath sounds: Normal breath sounds. No stridor. No wheezing, rhonchi or rales.  Musculoskeletal:     Cervical back: Normal range of motion and neck supple. No rigidity or tenderness.  Lymphadenopathy:     Cervical: No cervical adenopathy.  Skin:    General: Skin is warm and dry.     Capillary Refill: Capillary refill takes less than 2 seconds.  Neurological:     General: No focal deficit present.     Mental Status: She is alert.  UC Treatments / Results  Labs (all labs ordered are listed, but only abnormal results are displayed) Labs Reviewed  RESP PANEL BY RT-PCR (FLU A&B, COVID) [id number]    EKG   Radiology No results found.  Procedures Procedures (including critical care time)  Medications Ordered in UC Medications - No data to display  Initial Impression / Assessment and Plan / UC Course  I have reviewed the triage vital signs and the nursing notes.  Pertinent labs & imaging results that were available during my care of the patient were reviewed by me and considered in my medical decision making (see chart for details).  Clinical impression very mild early upper respiratory infection.  She has been around sick grandkids.  She only has some mild rhinorrhea mild postnasal drip and sneezing.  Some mild congestion and cough is also noted per history.  Her exam is extremely reassuring.  Treatment plan: 1.  The findings and treatment plan were discussed in detail with the patient.  Patient was in agreement. 2.  I just recommended supportive care.  Plenty of fluids, plenty of rest.  If she is around her 33 year old mother she should practice social distancing when appropriate as well as use of the mask handwashing and all Covid protocol precautions. 3.  Over-the-counter meds as needed. 4.  Welcome to see [place] in the future if things were to progress but for now we will discharge her from care.     Final Clinical Impressions(s) / UC Diagnoses   Final diagnoses:  Viral URI with cough     Discharge Instructions     I just recommended supportive care.  Plenty of fluids, plenty of rest.  If she is around her 92 year old mother she should practice social distancing when appropriate as well as use of the mask handwashing and all Covid protocol precautions. Over-the-counter meds as needed. Welcome to see [place] in the future if things were to progress but for now we will discharge her from care.    ED  Prescriptions    None     PDMP not reviewed this encounter.   Verda Cumins, MD 02/13/20 1051

## 2020-02-25 ENCOUNTER — Other Ambulatory Visit: Payer: Self-pay | Admitting: Obstetrics & Gynecology

## 2020-02-25 DIAGNOSIS — Z13820 Encounter for screening for osteoporosis: Secondary | ICD-10-CM

## 2020-04-19 ENCOUNTER — Telehealth: Payer: Self-pay

## 2020-04-19 NOTE — Telephone Encounter (Signed)
-----   Message from Gae Dry, MD sent at 04/19/2020 11:55 AM EST ----- Regarding: DEXA Received notice she has not received DEXA yet as ordered at her Annual. Please check and encourage her to do this, and document conversation.

## 2020-04-19 NOTE — Telephone Encounter (Signed)
Left message to advise pt to schedule her Dexa scan

## 2020-04-27 ENCOUNTER — Encounter: Payer: Self-pay | Admitting: Family Medicine

## 2020-04-27 ENCOUNTER — Other Ambulatory Visit: Payer: Self-pay

## 2020-04-27 ENCOUNTER — Ambulatory Visit: Payer: Medicare PPO | Admitting: Family Medicine

## 2020-04-27 VITALS — BP 130/80 | HR 60 | Ht 65.5 in | Wt 176.0 lb

## 2020-04-27 DIAGNOSIS — R1011 Right upper quadrant pain: Secondary | ICD-10-CM | POA: Diagnosis not present

## 2020-04-27 DIAGNOSIS — S29011A Strain of muscle and tendon of front wall of thorax, initial encounter: Secondary | ICD-10-CM | POA: Diagnosis not present

## 2020-04-27 NOTE — Progress Notes (Signed)
Date:  04/27/2020   Name:  Olivia Richards   DOB:  [birth year]   MRN:  [id number]   Chief Complaint: Flank Pain (Starts in center and radiates around the R) side/ ribs. Noticed this started after coughing so much with COVID. Meloxicam helps some)  Chest Pain  This is a new problem. The current episode started more than 1 month ago. The onset quality is gradual. The problem occurs daily. The problem has been waxing and waning. The pain is present in the lateral region. The pain is moderate. The pain radiates to the mid back. Associated symptoms include back pain. Pertinent negatives include no abdominal pain, claudication, cough, diaphoresis, dizziness, exertional chest pressure, fever, headaches, hemoptysis, irregular heartbeat, leg pain, lower extremity edema, malaise/fatigue, nausea, near-syncope, numbness, orthopnea, palpitations, PND, shortness of breath, sputum production, syncope, vomiting or weakness. The pain is aggravated by coughing, deep breathing and movement (lifting right leg).    Lab Results  Component Value Date   CREATININE 0.89 04/22/2019   BUN 16 04/22/2019   NA 138 04/22/2019   K 4.2 04/22/2019   CL 105 04/22/2019   CO2 24 04/22/2019   Lab Results  Component Value Date   CHOL 225 (H) 11/20/2018   HDL 60 11/20/2018   LDLCALC 145 (H) 11/20/2018   TRIG 115 11/20/2018   CHOLHDL 3.6 01/25/2018   No results found for: TSH No results found for: HGBA1C Lab Results  Component Value Date   WBC 7.6 04/22/2019   HGB 14.0 04/22/2019   HCT 43.0 04/22/2019   MCV 80.8 04/22/2019   PLT 314 04/22/2019   Lab Results  Component Value Date   ALT 27 04/22/2019   AST 22 04/22/2019   ALKPHOS 89 04/22/2019   BILITOT 0.9 04/22/2019     Review of Systems  Constitutional: Negative.  Negative for chills, diaphoresis, fatigue, fever, malaise/fatigue and unexpected weight change.  HENT: Negative for congestion, ear discharge, ear pain, rhinorrhea, sinus pressure, sneezing  and sore throat.   Eyes: Negative for double vision, photophobia, pain, discharge, redness and itching.  Respiratory: Negative for cough, hemoptysis, sputum production, shortness of breath, wheezing and stridor.   Cardiovascular: Positive for chest pain. Negative for palpitations, orthopnea, claudication, syncope, PND and near-syncope.  Gastrointestinal: Negative for abdominal pain, blood in stool, constipation, diarrhea, nausea and vomiting.  Endocrine: Negative for cold intolerance, heat intolerance, polydipsia, polyphagia and polyuria.  Genitourinary: Negative for dysuria, flank pain, frequency, hematuria, menstrual problem, pelvic pain, urgency, vaginal bleeding and vaginal discharge.  Musculoskeletal: Positive for back pain. Negative for arthralgias and myalgias.  Skin: Negative for rash.  Allergic/Immunologic: Negative for environmental allergies and food allergies.  Neurological: Negative for dizziness, weakness, light-headedness, numbness and headaches.  Hematological: Negative for adenopathy. Does not bruise/bleed easily.  Psychiatric/Behavioral: Negative for dysphoric mood. The patient is not nervous/anxious.     Patient Active Problem List   Diagnosis Date Noted  . S/P vaginal hysterectomy 05/07/2019  . Uterine prolapse 03/18/2019  . Dysuria 06/12/2018  . Vulvar candidiasis 06/12/2018    Allergies  Allergen Reactions  . Levaquin [Levofloxacin In D5w] Itching and Other (See Comments)    blisters  . Onion Other (See Comments)    Diarrhea and vomiting    Past Surgical History:  Procedure Laterality Date  . CERVIX SURGERY  POLYP  . COLONOSCOPY  2013   Dr Gustavo Lah- every 5 yrs  . CYSTOCELE REPAIR N/A 04/24/2019   Procedure: ANTERIOR REPAIR (CYSTOCELE);  Surgeon: Gae Dry, MD;  Location: [facility];  Service: Gynecology;  Laterality: N/A;  . FOOT SURGERY Left   . lithotripsy    . VAGINAL HYSTERECTOMY N/A 04/24/2019   Procedure: HYSTERECTOMY VAGINAL/ BSO;  Surgeon:  Gae Dry, MD;  Location: [facility];  Service: Gynecology;  Laterality: N/A;    Social History   Tobacco Use  . Smoking status: Never Smoker  . Smokeless tobacco: Never Used  Vaping Use  . Vaping Use: Never used  Substance Use Topics  . Alcohol use: No  . Drug use: No     Medication list has been reviewed and updated.  Current Meds  Medication Sig  . acetaminophen (TYLENOL) 500 MG tablet Take 500-1,000 mg by mouth every 6 (six) hours as needed (for pain).  . clotrimazole-betamethasone (LOTRISONE) cream Apply 1 application topically 2 (two) times daily.  Marland Kitchen conjugated estrogens (PREMARIN) vaginal cream 1 gram vaginally every other night at bedtime for 2 weeks, then 1 gram vaginally twice weekly  . meloxicam (MOBIC) 7.5 MG tablet TAKE 1 TABLET BY MOUTH EVERY DAY    PHQ 2/9 Scores 06/30/2019 06/10/2015 02/19/2015  PHQ - 2 Score 0 0 1  PHQ- 9 Score 0 - -    GAD 7 : Generalized Anxiety Score 06/30/2019  Nervous, Anxious, on Edge 1  Control/stop worrying 0  Worry too much - different things 2  Trouble relaxing 0  Restless 0  Easily annoyed or irritable 0  Afraid - awful might happen 0  Total GAD 7 Score 3  Anxiety Difficulty Not difficult at all    BP Readings from Last 3 Encounters:  04/27/20 130/80  02/13/20 (!) 153/85  12/31/19 120/80    Physical Exam Vitals and nursing note reviewed.  Constitutional:      Appearance: She is well-developed and well-nourished.  HENT:     Head: Normocephalic.     Right Ear: Tympanic membrane and external ear normal.     Left Ear: External ear normal.     Nose: Nose normal.     Mouth/Throat:     Mouth: Oropharynx is clear and moist. Mucous membranes are moist.  Eyes:     General: Lids are everted, no foreign bodies appreciated. No scleral icterus.       Left eye: No foreign body or hordeolum.     Extraocular Movements: EOM normal.     Conjunctiva/sclera: Conjunctivae normal.     Right eye: Right conjunctiva is not  injected.     Left eye: Left conjunctiva is not injected.     Pupils: Pupils are equal, round, and reactive to light.  Neck:     Thyroid: No thyromegaly.     Vascular: No JVD.     Trachea: No tracheal deviation.  Cardiovascular:     Rate and Rhythm: Normal rate and regular rhythm.     Pulses: Intact distal pulses.     Heart sounds: Normal heart sounds. No murmur heard. No friction rub. No gallop.   Pulmonary:     Effort: Pulmonary effort is normal. No respiratory distress.     Breath sounds: Normal breath sounds. No decreased breath sounds, wheezing, rhonchi or rales.  Chest:     Chest wall: Tenderness present.    Abdominal:     General: Bowel sounds are normal.     Palpations: Abdomen is soft. There is no hepatomegaly, splenomegaly, hepatosplenomegaly or mass.     Tenderness: There is abdominal tenderness in the right upper quadrant. There is no guarding or rebound.  Musculoskeletal:        General: No tenderness or edema. Normal range of motion.     Cervical back: Normal range of motion and neck supple.  Lymphadenopathy:     Cervical: No cervical adenopathy.  Skin:    General: Skin is warm.     Findings: No rash.  Neurological:     Mental Status: She is alert and oriented to person, place, and time.     Cranial Nerves: No cranial nerve deficit.     Deep Tendon Reflexes: Strength normal. Reflexes normal.  Psychiatric:        Mood and Affect: Mood and affect normal. Mood is not anxious or depressed.     Wt Readings from Last 3 Encounters:  04/27/20 176 lb (79.8 kg)  02/13/20 165 lb (74.8 kg)  12/31/19 176 lb (79.8 kg)    BP 130/80   Pulse 60   Ht 5' 5.5" (1.664 m)   Wt 176 lb (79.8 kg)   BMI 28.84 kg/m   Assessment and Plan: 1. Intercostal muscle strain, initial encounter Repeat onset.  Patient developed pain in the right lower rib cage intercostal muscles after contracting Covid and having coughing over January.  This is persisted patient presents again  today.  This is similar to something about 1 year ago.  On examination there is tenderness and a positive Carnett's sign over the 10th rib/chondral margin and associated intercostal space.  However the there may be some tenderness and palpable firmness in the right upper quadrant consistent with possible hepatomegaly.  Patient has had pain in the midepigastric as well.  We will check hepatic panel and a lipase and obtain a right upper quadrant epigastric ultrasound for evaluation.  2. Right upper quadrant abdominal pain As noted above right upper quadrant discomfort but is coincidental that this was a similar episode a year ago.  We are going to proceed with evaluation with ultrasound of the liver and gallbladder area. - Hepatic Function Panel (6) - Lipase - US Abdomen Limited RUQ (LIVER/GB); Future

## 2020-04-28 LAB — HEPATIC FUNCTION PANEL (6)
ALT: 19 IU/L (ref 0–32)
AST: 23 IU/L (ref 0–40)
Albumin: 4.8 g/dL (ref 3.8–4.8)
Alkaline Phosphatase: 118 IU/L (ref 44–121)
Bilirubin Total: 0.5 mg/dL (ref 0.0–1.2)
Bilirubin, Direct: 0.15 mg/dL (ref 0.00–0.40)

## 2020-04-28 LAB — LIPASE: Lipase: 12 U/L — ABNORMAL LOW (ref 14–72)

## 2020-05-03 ENCOUNTER — Telehealth: Payer: Self-pay

## 2020-05-03 ENCOUNTER — Other Ambulatory Visit: Payer: Self-pay

## 2020-05-03 DIAGNOSIS — R1011 Right upper quadrant pain: Secondary | ICD-10-CM

## 2020-05-03 MED ORDER — ONDANSETRON HCL 4 MG PO TABS: 4.0000 mg | ORAL_TABLET | Freq: Three times a day (TID) | ORAL | 0 refills | Status: DC | PRN

## 2020-05-03 NOTE — Telephone Encounter (Unsigned)
Copied from [place] [id number]. Topic: General - Other >> May 03, 2020 12:46 PM Keene Breath wrote: Reason for CRM: Patient called to speak with Baxter Flattery regarding her symptoms she is still having.  She said she would like her to call so she can get advice as to what she should do.  CB# [phone]

## 2020-05-03 NOTE — Telephone Encounter (Signed)
Patient returning call, patient disconnected the line. Best # [phone]

## 2020-05-03 NOTE — Telephone Encounter (Signed)
Moved patients [place] to STAT and she is now scheduled for tomorrow morning. Called in Zofran for nausea to help with her symptoms until we find out if this is her gall bladder. Left patient Vm and informed of this.

## 2020-05-04 ENCOUNTER — Other Ambulatory Visit: Payer: Self-pay

## 2020-05-04 ENCOUNTER — Telehealth: Payer: Self-pay

## 2020-05-04 ENCOUNTER — Ambulatory Visit: Admission: RE | Admit: 2020-05-04 | Payer: Medicare PPO | Source: Ambulatory Visit

## 2020-05-04 ENCOUNTER — Ambulatory Visit
Admission: RE | Admit: 2020-05-04 | Discharge: 2020-05-04 | Disposition: A | Discharge status: Home / Self Care | Payer: Medicare PPO | Source: Ambulatory Visit | Attending: Family Medicine | Admitting: Family Medicine

## 2020-05-04 DIAGNOSIS — R1011 Right upper quadrant pain: Secondary | ICD-10-CM

## 2020-05-04 DIAGNOSIS — R109 Unspecified abdominal pain: Secondary | ICD-10-CM | POA: Diagnosis not present

## 2020-05-04 MED ORDER — PANTOPRAZOLE SODIUM 40 MG PO TBEC: 40.0000 mg | DELAYED_RELEASE_TABLET | Freq: Every day | ORAL | 3 refills | Status: DC

## 2020-05-04 NOTE — Telephone Encounter (Signed)
Spoke to [place] tech

## 2020-05-04 NOTE — Telephone Encounter (Signed)
Copied from [place] [id number]. Topic: General - Other >> May 03, 2020  4:41 PM Pawlus, Brayton Layman A wrote: Reason for CRM: [facility] needs an order for an ultrasound faxed over as soon as possible. Pt has an appt early tomorrow morning and they will not be able to do it without this information.

## 2020-05-04 NOTE — Progress Notes (Unsigned)
Called with normal [place], start pantoprazole and stop meloxicam

## 2020-05-10 ENCOUNTER — Ambulatory Visit: Payer: Medicare PPO

## 2020-07-06 ENCOUNTER — Ambulatory Visit: Payer: Medicare PPO | Admitting: Family Medicine

## 2020-07-06 ENCOUNTER — Other Ambulatory Visit: Payer: Self-pay

## 2020-07-06 ENCOUNTER — Encounter: Payer: Self-pay | Admitting: Family Medicine

## 2020-07-06 VITALS — BP 120/80 | HR 80 | Ht 65.5 in | Wt 168.0 lb

## 2020-07-06 DIAGNOSIS — F419 Anxiety disorder, unspecified: Secondary | ICD-10-CM

## 2020-07-06 DIAGNOSIS — F32A Depression, unspecified: Secondary | ICD-10-CM | POA: Diagnosis not present

## 2020-07-06 DIAGNOSIS — M62838 Other muscle spasm: Secondary | ICD-10-CM

## 2020-07-06 DIAGNOSIS — H6121 Impacted cerumen, right ear: Secondary | ICD-10-CM

## 2020-07-06 MED ORDER — CYCLOBENZAPRINE HCL 5 MG PO TABS: 5.0000 mg | ORAL_TABLET | Freq: Every day | ORAL | 1 refills | Status: DC

## 2020-07-06 NOTE — Progress Notes (Signed)
Date:  07/06/2020   Name:  Olivia Richards   DOB:  February 18, 1956   MRN:  [id number]   Chief Complaint: anxiety and depression (Living with son and daughter-n-law. They are splitting. 11 and 20 on PHQ and GAD)  Depression        This is a chronic problem.  The current episode started more than 1 year ago.   The onset quality is gradual.   The problem has been gradually improving since onset.  Associated symptoms include decreased concentration, fatigue, helplessness, hopelessness, insomnia, irritable, restlessness, decreased interest, appetite change, body aches, myalgias and sad.  Associated symptoms include no headaches, no indigestion and no suicidal ideas.  Past treatments include SSRIs - Selective serotonin reuptake inhibitors.  Compliance with treatment is good.  Previous treatment provided mild relief.   Lab Results  Component Value Date   CREATININE 0.89 04/22/2019   BUN 16 04/22/2019   NA 138 04/22/2019   K 4.2 04/22/2019   CL 105 04/22/2019   CO2 24 04/22/2019   Lab Results  Component Value Date   CHOL 225 (H) 11/20/2018   HDL 60 11/20/2018   LDLCALC 145 (H) 11/20/2018   TRIG 115 11/20/2018   CHOLHDL 3.6 01/25/2018   No results found for: TSH No results found for: HGBA1C Lab Results  Component Value Date   WBC 7.6 04/22/2019   HGB 14.0 04/22/2019   HCT 43.0 04/22/2019   MCV 80.8 04/22/2019   PLT 314 04/22/2019   Lab Results  Component Value Date   ALT 19 04/27/2020   AST 23 04/27/2020   ALKPHOS 118 04/27/2020   BILITOT 0.5 04/27/2020     Review of Systems  Constitutional: Positive for appetite change and fatigue. Negative for chills, fever and unexpected weight change.  HENT: Negative for congestion, ear discharge, ear pain, rhinorrhea, sinus pressure, sneezing and sore throat.   Eyes: Negative for photophobia, pain, discharge, redness and itching.  Respiratory: Negative for cough, shortness of breath, wheezing and stridor.   Gastrointestinal: Negative  for abdominal pain, blood in stool, constipation, diarrhea, nausea and vomiting.  Endocrine: Negative for cold intolerance, heat intolerance, polydipsia, polyphagia and polyuria.  Genitourinary: Negative for dysuria, flank pain, frequency, hematuria, menstrual problem, pelvic pain, urgency, vaginal bleeding and vaginal discharge.  Musculoskeletal: Positive for myalgias. Negative for arthralgias and back pain.  Skin: Negative for rash.  Allergic/Immunologic: Negative for environmental allergies and food allergies.  Neurological: Negative for dizziness, weakness, light-headedness, numbness and headaches.  Hematological: Negative for adenopathy. Does not bruise/bleed easily.  Psychiatric/Behavioral: Positive for decreased concentration and depression. Negative for dysphoric mood and suicidal ideas. The patient has insomnia. The patient is not nervous/anxious.     Patient Active Problem List   Diagnosis Date Noted  . S/P vaginal hysterectomy 05/07/2019  . Uterine prolapse 03/18/2019  . Dysuria 06/12/2018  . Vulvar candidiasis 06/12/2018    Allergies  Allergen Reactions  . Levaquin [Levofloxacin In D5w] Itching and Other (See Comments)    blisters  . Onion Other (See Comments)    Diarrhea and vomiting    Past Surgical History:  Procedure Laterality Date  . CERVIX SURGERY  POLYP  . COLONOSCOPY  2013   Dr Gustavo Lah- every 5 yrs  . CYSTOCELE REPAIR N/A 04/24/2019   Procedure: ANTERIOR REPAIR (CYSTOCELE);  Surgeon: Gae Dry, MD;  Location: [facility] ORS;  Service: Gynecology;  Laterality: N/A;  . FOOT SURGERY Left   . lithotripsy    . VAGINAL HYSTERECTOMY N/A 04/24/2019  Procedure: HYSTERECTOMY VAGINAL/ BSO;  Surgeon: Gae Dry, MD;  Location: [facility] ORS;  Service: Gynecology;  Laterality: N/A;    Social History   Tobacco Use  . Smoking status: Never Smoker  . Smokeless tobacco: Never Used  Vaping Use  . Vaping Use: Never used  Substance Use Topics  . Alcohol use: No  .  Drug use: No     Medication list has been reviewed and updated.  Current Meds  Medication Sig  . acetaminophen (TYLENOL) 500 MG tablet Take 500-1,000 mg by mouth every 6 (six) hours as needed (for pain).  . clotrimazole-betamethasone (LOTRISONE) cream Apply 1 application topically 2 (two) times daily.  Marland Kitchen conjugated estrogens (PREMARIN) vaginal cream 1 gram vaginally every other night at bedtime for 2 weeks, then 1 gram vaginally twice weekly  . cyclobenzaprine (FLEXERIL) 5 MG tablet Take 5 mg by mouth at bedtime.  . [DISCONTINUED] ondansetron (ZOFRAN) 4 MG tablet Take 1 tablet (4 mg total) by mouth every 8 (eight) hours as needed for nausea or vomiting.    PHQ 2/9 Scores 07/06/2020 06/30/2019 06/10/2015 02/19/2015  PHQ - 2 Score 6 0 0 1  PHQ- 9 Score 12 0 - -    GAD 7 : Generalized Anxiety Score 07/06/2020 06/30/2019  Nervous, Anxious, on Edge 3 1  Control/stop worrying 3 0  Worry too much - different things 3 2  Trouble relaxing 3 0  Restless 3 0  Easily annoyed or irritable 3 0  Afraid - awful might happen 3 0  Total GAD 7 Score 21 3  Anxiety Difficulty Not difficult at all Not difficult at all    BP Readings from Last 3 Encounters:  07/06/20 120/80  04/27/20 130/80  02/13/20 (!) 153/85    Physical Exam Vitals and nursing note reviewed.  Constitutional:      General: She is irritable.     Appearance: She is well-developed.  HENT:     Head: Normocephalic.     Right Ear: Tympanic membrane, ear canal and external ear normal. There is no impacted cerumen.     Left Ear: Tympanic membrane, ear canal and external ear normal. There is no impacted cerumen.     Nose: Nose normal.     Mouth/Throat:     Mouth: Mucous membranes are moist.  Eyes:     General: Lids are everted, no foreign bodies appreciated. No scleral icterus.       Left eye: No foreign body or hordeolum.     Conjunctiva/sclera: Conjunctivae normal.     Right eye: Right conjunctiva is not injected.     Left eye:  Left conjunctiva is not injected.     Pupils: Pupils are equal, round, and reactive to light.  Neck:     Thyroid: No thyromegaly.     Vascular: No JVD.     Trachea: No tracheal deviation.  Cardiovascular:     Rate and Rhythm: Normal rate and regular rhythm.     Heart sounds: Normal heart sounds. No murmur heard. No friction rub. No gallop.   Pulmonary:     Effort: Pulmonary effort is normal. No respiratory distress.     Breath sounds: Normal breath sounds. No wheezing, rhonchi or rales.  Chest:     Chest wall: No tenderness.  Abdominal:     General: Bowel sounds are normal.     Palpations: Abdomen is soft. There is no mass.     Tenderness: There is no abdominal tenderness. There is no right CVA  tenderness, left CVA tenderness, guarding or rebound.  Musculoskeletal:        General: No tenderness. Normal range of motion.     Cervical back: Normal range of motion and neck supple. No tenderness.  Lymphadenopathy:     Cervical: No cervical adenopathy.  Skin:    General: Skin is warm.     Capillary Refill: Capillary refill takes less than 2 seconds.     Findings: No rash.  Neurological:     Mental Status: She is alert and oriented to person, place, and time.     Cranial Nerves: No cranial nerve deficit.     Deep Tendon Reflexes: Reflexes normal.  Psychiatric:        Mood and Affect: Mood is not anxious or depressed.     Wt Readings from Last 3 Encounters:  07/06/20 168 lb (76.2 kg)  04/27/20 176 lb (79.8 kg)  02/13/20 165 lb (74.8 kg)    BP 120/80   Pulse 80   Ht 5' 5.5" (1.664 m)   Wt 168 lb (76.2 kg)   BMI 27.53 kg/m   Assessment and Plan:  1. Anxiety and depression new onset.  Persistent.  Uncontrolled.  Complicated.  Patient has multiple responsibilities that has been placed upon her unexpectedly and she is at the point of breaking both from a depression and anxiety standpoint.  I would rather hold on starting medication until we can get her in with [facility] on an as soon  as possible basis - Ambulatory referral to Psychiatry  2. Impacted cerumen of right ear New onset.  Persistent.  There is pain with pulling of the pinna.  Will refer to ENT to clean out cerumen because she is recently been to [place] and swam in the ocean and there is some probably some sequelae secondary to that. - Ambulatory referral to ENT

## 2020-07-07 ENCOUNTER — Telehealth: Payer: Self-pay

## 2020-07-07 DIAGNOSIS — R630 Anorexia: Secondary | ICD-10-CM | POA: Diagnosis not present

## 2020-07-07 DIAGNOSIS — F411 Generalized anxiety disorder: Secondary | ICD-10-CM | POA: Diagnosis not present

## 2020-07-07 NOTE — Telephone Encounter (Unsigned)
Copied from [place] [id number]. Topic: General - Call Back - No Documentation >> Jul 07, 2020 10:43 AM Erick Blinks wrote: Reason for CRM: Pt called the [facility] in [facility] and they have zero openings for therapy, Pt needs a call back from [facility] for further instructions  Best contact: [phone]

## 2020-07-07 NOTE — Telephone Encounter (Signed)
[phone]- she can try and call this number for [facility], but it is advised that she still go to [facility] for the medication part of it. A therapist cannot prescribe meds

## 2020-07-08 NOTE — Telephone Encounter (Signed)
Left message with info.

## 2020-07-21 DIAGNOSIS — H6982 Other specified disorders of Eustachian tube, left ear: Secondary | ICD-10-CM | POA: Diagnosis not present

## 2020-07-21 DIAGNOSIS — H6121 Impacted cerumen, right ear: Secondary | ICD-10-CM | POA: Diagnosis not present

## 2020-07-21 DIAGNOSIS — H903 Sensorineural hearing loss, bilateral: Secondary | ICD-10-CM | POA: Diagnosis not present

## 2020-07-31 ENCOUNTER — Other Ambulatory Visit: Payer: Self-pay | Admitting: Family Medicine

## 2020-07-31 DIAGNOSIS — R1011 Right upper quadrant pain: Secondary | ICD-10-CM

## 2020-07-31 NOTE — Telephone Encounter (Signed)
last RF 05/04/20 #30 3 RF- next RF due 09/03/20

## 2020-09-14 DIAGNOSIS — L578 Other skin changes due to chronic exposure to nonionizing radiation: Secondary | ICD-10-CM | POA: Diagnosis not present

## 2020-09-14 DIAGNOSIS — Z872 Personal history of diseases of the skin and subcutaneous tissue: Secondary | ICD-10-CM | POA: Diagnosis not present

## 2020-09-21 ENCOUNTER — Telehealth: Payer: Self-pay | Admitting: Family Medicine

## 2020-09-21 NOTE — Telephone Encounter (Signed)
Copied from [place] [phone]. Topic: Medicare AWV >> Sep 21, 2020 10:54 AM Cher Nakai R wrote: Reason for CRM:  Left message for patient to call back and schedule Medicare Annual Wellness Visit (AWV) in office.   If unable to come into the office for AWV,  please offer to do virtually or by telephone.  No hx of AWV eligible for AWVI as of 02/21/2020  Please schedule at anytime with [facility] Health Advisor.      40 Minutes appointment   Any questions, please call me at [phone]

## 2020-10-05 ENCOUNTER — Ambulatory Visit: Payer: Medicare PPO | Admitting: Obstetrics & Gynecology

## 2020-10-18 ENCOUNTER — Ambulatory Visit
Admission: RE | Admit: 2020-10-18 | Discharge: 2020-10-18 | Disposition: A | Discharge status: Home / Self Care | Payer: Medicare PPO | Source: Ambulatory Visit | Attending: Obstetrics & Gynecology | Admitting: Obstetrics & Gynecology

## 2020-10-18 ENCOUNTER — Other Ambulatory Visit: Payer: Self-pay

## 2020-10-18 DIAGNOSIS — Z780 Asymptomatic menopausal state: Secondary | ICD-10-CM | POA: Diagnosis not present

## 2020-10-18 DIAGNOSIS — Z90722 Acquired absence of ovaries, bilateral: Secondary | ICD-10-CM | POA: Insufficient documentation

## 2020-10-18 DIAGNOSIS — M810 Age-related osteoporosis without current pathological fracture: Secondary | ICD-10-CM | POA: Diagnosis not present

## 2020-10-18 DIAGNOSIS — M8588 Other specified disorders of bone density and structure, other site: Secondary | ICD-10-CM | POA: Diagnosis not present

## 2020-10-18 DIAGNOSIS — Z13820 Encounter for screening for osteoporosis: Secondary | ICD-10-CM | POA: Insufficient documentation

## 2020-10-19 ENCOUNTER — Ambulatory Visit (INDEPENDENT_AMBULATORY_CARE_PROVIDER_SITE_OTHER): Payer: Medicare PPO | Admitting: Obstetrics & Gynecology

## 2020-10-19 ENCOUNTER — Encounter: Payer: Self-pay | Admitting: Obstetrics & Gynecology

## 2020-10-19 VITALS — BP 120/80 | Ht 65.5 in | Wt 167.0 lb

## 2020-10-19 DIAGNOSIS — E7800 Pure hypercholesterolemia, unspecified: Secondary | ICD-10-CM | POA: Diagnosis not present

## 2020-10-19 DIAGNOSIS — M810 Age-related osteoporosis without current pathological fracture: Secondary | ICD-10-CM

## 2020-10-19 DIAGNOSIS — Z13220 Encounter for screening for lipoid disorders: Secondary | ICD-10-CM

## 2020-10-19 DIAGNOSIS — Z1329 Encounter for screening for other suspected endocrine disorder: Secondary | ICD-10-CM

## 2020-10-19 DIAGNOSIS — Z1231 Encounter for screening mammogram for malignant neoplasm of breast: Secondary | ICD-10-CM

## 2020-10-19 DIAGNOSIS — E559 Vitamin D deficiency, unspecified: Secondary | ICD-10-CM | POA: Diagnosis not present

## 2020-10-19 DIAGNOSIS — Z1321 Encounter for screening for nutritional disorder: Secondary | ICD-10-CM | POA: Diagnosis not present

## 2020-10-19 DIAGNOSIS — R7303 Prediabetes: Secondary | ICD-10-CM | POA: Diagnosis not present

## 2020-10-19 DIAGNOSIS — Z01419 Encounter for gynecological examination (general) (routine) without abnormal findings: Secondary | ICD-10-CM

## 2020-10-19 DIAGNOSIS — B373 Candidiasis of vulva and vagina: Secondary | ICD-10-CM | POA: Diagnosis not present

## 2020-10-19 DIAGNOSIS — Z131 Encounter for screening for diabetes mellitus: Secondary | ICD-10-CM

## 2020-10-19 DIAGNOSIS — B3731 Acute candidiasis of vulva and vagina: Secondary | ICD-10-CM

## 2020-10-19 LAB — HM PAP SMEAR: HM Pap smear: NORMAL

## 2020-10-19 MED ORDER — CLOTRIMAZOLE-BETAMETHASONE 1-0.05 % EX CREA: 1.0000 "application " | TOPICAL_CREAM | Freq: Two times a day (BID) | CUTANEOUS | 5 refills | Status: DC | PRN

## 2020-10-19 MED ORDER — RALOXIFENE HCL 60 MG PO TABS: 60.0000 mg | ORAL_TABLET | Freq: Every day | ORAL | 3 refills | Status: DC

## 2020-10-19 NOTE — Progress Notes (Signed)
HPI:      [name] is a 65 y.o. G2P2002 who LMP was in the past, she presents today for her annual examination.  The patient has no complaints today other than: Continues w vulvar itch when hot/ sweats.  More anxiety this year. The patient is sexually active. Herlast pap: was normal and last mammogram: approximate date 2021 and was normal.  The patient does perform self breast exams.  There is notable family history of breast or ovarian cancer in her family. The patient is not taking hormone replacement therapy. Estrogen vag therapy did not help, whereas Lotrisone does seem to help. Patient denies post-menopausal vaginal bleeding.   The patient has regular exercise: yes. The patient denies current symptoms of depression.    GYN Hx: Last Colonoscopy:4 years ago. Normal.  Last DEXA:  1 day  ago. Dx: Osteoporosis.   PMHx: Past Medical History:  Diagnosis Date   DDD (degenerative disc disease), cervical    H/O colonoscopy 2017   History of kidney stones    Low blood pressure    Past Surgical History:  Procedure Laterality Date   CERVIX SURGERY  POLYP   COLONOSCOPY  2013   Dr Gustavo Lah- every 5 yrs   CYSTOCELE REPAIR N/A 04/24/2019   Procedure: ANTERIOR REPAIR (CYSTOCELE);  Surgeon: Gae Dry, MD;  Location: [facility] ORS;  Service: Gynecology;  Laterality: N/A;   FOOT SURGERY Left    lithotripsy     VAGINAL HYSTERECTOMY N/A 04/24/2019   Procedure: HYSTERECTOMY VAGINAL/ BSO;  Surgeon: Gae Dry, MD;  Location: [facility] ORS;  Service: Gynecology;  Laterality: N/A;   Family History  Problem Relation Age of Onset   Breast cancer Mother    COPD Mother    Hyperlipidemia Mother    Heart disease Father    Social History   Tobacco Use   Smoking status: Never   Smokeless tobacco: Never  Vaping Use   Vaping Use: Never used  Substance Use Topics   Alcohol use: No   Drug use: No    Current Outpatient Medications:    raloxifene (EVISTA) 60 MG tablet, Take 1 tablet (60 mg  total) by mouth daily., Disp: 90 tablet, Rfl: 3   clotrimazole-betamethasone (LOTRISONE) cream, Apply 1 application topically 2 (two) times daily as needed., Disp: 45 g, Rfl: 5 Allergies: Levaquin [levofloxacin in d5w] and Onion  Review of Systems  Constitutional:  Negative for chills, fever and malaise/fatigue.  HENT:  Negative for congestion, sinus pain and sore throat.   Eyes:  Negative for blurred vision and pain.  Respiratory:  Negative for cough and wheezing.   Cardiovascular:  Negative for chest pain and leg swelling.  Gastrointestinal:  Negative for abdominal pain, constipation, diarrhea, heartburn, nausea and vomiting.  Genitourinary:  Negative for dysuria, frequency, hematuria and urgency.  Musculoskeletal:  Negative for back pain, joint pain, myalgias and neck pain.  Skin:  Negative for itching and rash.  Neurological:  Negative for dizziness, tremors and weakness.  Endo/Heme/Allergies:  Does not bruise/bleed easily.  Psychiatric/Behavioral:  Positive for depression. The patient is nervous/anxious. The patient does not have insomnia.    Objective: BP 120/80   Ht 5' 5.5" (1.664 m)   Wt 167 lb (75.8 kg)   BMI 27.37 kg/m   Filed Weights   10/19/20 0845  Weight: 167 lb (75.8 kg)   Body mass index is 27.37 kg/m. Physical Exam Constitutional:      General: She is not in acute distress.  Appearance: She is well-developed.  Genitourinary:     Vulva, bladder, rectum and urethral meatus normal.     No lesions in the vagina.     Genitourinary Comments: Vaginal cuff well healed     Right Labia: No rash, tenderness or lesions.    Left Labia: No tenderness, lesions or rash.    No vaginal bleeding.      Right Adnexa: not tender and no mass present.    Left Adnexa: not tender and no mass present.    Cervix is absent.     Uterus is absent.     Pelvic exam was performed with patient in the lithotomy position.  Breasts:    Right: No mass, skin change or tenderness.     Left:  No mass, skin change or tenderness.  HENT:     Head: Normocephalic and atraumatic. No laceration.     Right Ear: Hearing normal.     Left Ear: Hearing normal.     Mouth/Throat:     Pharynx: Uvula midline.  Eyes:     Pupils: Pupils are equal, round, and reactive to light.  Neck:     Thyroid: No thyromegaly.  Cardiovascular:     Rate and Rhythm: Normal rate and regular rhythm.     Heart sounds: No murmur heard.   No friction rub. No gallop.  Pulmonary:     Effort: Pulmonary effort is normal. No respiratory distress.     Breath sounds: Normal breath sounds. No wheezing.  Abdominal:     General: Bowel sounds are normal. There is no distension.     Palpations: Abdomen is soft.     Tenderness: There is no abdominal tenderness. There is no rebound.  Musculoskeletal:        General: Normal range of motion.     Cervical back: Normal range of motion and neck supple.  Neurological:     Mental Status: She is alert and oriented to person, place, and time.     Cranial Nerves: No cranial nerve deficit.  Skin:    General: Skin is warm and dry.  Psychiatric:        Judgment: Judgment normal.  Vitals reviewed.    Assessment: Annual Exam 1. Women's annual routine gynecological examination   2. Age-related osteoporosis without current pathological fracture   3. Encounter for screening mammogram for malignant neoplasm of breast   4. Vulvar candidiasis   5. Screening for thyroid disorder   6. Encounter for vitamin deficiency screening   7. Screening cholesterol level   8. Screening for diabetes mellitus     Plan:            1.  Cervical Screening-  Pap smear schedule reviewed with patient, Pap smear to be scheduled  2. Breast screening- Exam annually and mammogram scheduled  3. Colonoscopy every 5 years (planning in May)  4. Labs Ordered today  5. Counseling for hormonal therapy: none              6. Osteoporosis - Plan Evista; discussed alternatives such as Bisphosphonates and  ERT.  Declines ERT. Pros and cons of Evista discussed. DEXA f/u every 2 years   7. Lotrisone for vulvar itch and possible candidiasis    F/U  Return in about 1 year (around 10/19/2021) for Annual.  [name], MD, [facility], [facility] 10/19/2020  9:17 AM

## 2020-10-19 NOTE — Patient Instructions (Signed)
PAP every three years Mammogram every year    Call [phone] to schedule at [facility] Colonoscopy every 5 years Labs today  Thank you for choosing [facility]. As part of our ongoing efforts to improve patient experience, we would appreciate your feedback. Please fill out the short survey that you will receive by mail or MyChart. Your opinion is important to [place]! - Dr. Kenton Kingfisher  Raloxifene tablets What is this medication? RALOXIFENE (ral OX i feen) reduces the amount of calcium lost from bones. It is used to treat and prevent osteoporosis in women who have experienced menopause. It may also help prevent invasive breast cancer in certain women who have ahigh risk for breast cancer. This medicine may be used for other purposes; ask your health care provider orpharmacist if you have questions. COMMON BRAND NAME(S): Evista What should I tell my care team before I take this medication? They need to know if you have any of these conditions: a history of blood clots cancer heart disease or recent heart attack high levels of triglycerides (blood fat) in the blood history of stroke kidney disease liver disease premenopausal smoke tobacco an unusual or allergic reaction to raloxifene, other medicines, foods, dyes, or preservatives pregnant or trying to get pregnant breast-feeding How should I use this medication? Take this medicine by mouth with a glass of water. Follow the directions on the prescription label. The tablets can be taken with or without food. Take yourdoses at regular intervals. Do not take your medicine more often than directed. A special MedGuide will be given to you by the pharmacist with eachprescription and refill. Be sure to read this information carefully each time. Talk to your pediatrician regarding the use of this medicine in children.Special care may be needed. Overdosage: If you think you have taken too much of this medicine contact apoison control center or  emergency room at once. NOTE: This medicine is only for you. Do not share this medicine with others. What if I miss a dose? If you miss a dose, take it as soon as you can. If it is almost time for yournext dose, take only that dose. Do not take double or extra doses. What may interact with this medication? cholestyramine female hormones, like estrogens warfarin This list may not describe all possible interactions. Give your health care provider a list of all the medicines, herbs, non-prescription drugs, or dietary supplements you use. Also tell them if you smoke, drink alcohol, or use illegaldrugs. Some items may interact with your medicine. What should I watch for while using this medication? Visit your doctor or health care professional for regular checks on your progress. Do not stop taking this medicine except on the advice of your doctoror health care professional. If you are taking this medicine to reduce your risk of getting breast cancer, you should know that this medicine does not prevent all types of breast cancer.Talk to your doctor if you have questions. This medicine does not prevent hot flashes. It may cause hot flashes in somepatients at the start of therapy. You should make sure that you get enough calcium and vitamin D while you are taking this medicine. Discuss the foods you eat and the vitamins you take withyour health care professional. Exercise may help to prevent bone loss. Discuss your exercise needs with yourdoctor or health care professional. This medicine can rarely cause blood clots. If you are going to have surgery, tell your doctor or health care professional that you are taking this medicine. This  medicine should be stopped at least 3 days before surgery. After surgery, it should be restarted only after you are walking again. It should not berestarted while you still need long periods of bed rest. You should not smoke while taking this medicine. Smoking may increase your  riskof blood clots or stroke. If you have any reason to think you are pregnant; stop taking this medicine atonce and contact your doctor or health care professional. Do not breast feed while taking this medicine. What side effects may I notice from receiving this medication? Side effects that you should report to your doctor or health care professionalas soon as possible: allergic reactions like skin rash, itching or hives, swelling of the face, lips, or tongue) breast tissue changes or discharge signs and symptoms of a blood clot such as breathing problems; changes in vision; chest pain; severe, sudden headache; pain, swelling, warmth in the leg; trouble speaking; sudden numbness or weakness of the face, arm or leg signs and symptoms of a stroke like changes in vision; confusion; trouble speaking or understanding; severe headaches; sudden numbness or weakness of the face, arm or leg; trouble walking; dizziness; loss of balance or coordination vaginal discharge that is bloody, brown, or rust Side effects that usually do not require medical attention (report to yourdoctor or health care professional if they continue or are bothersome): hot flashes joint pain leg cramps sweating swelling of the ankles, feet, hands This list may not describe all possible side effects. Call your doctor for medical advice about side effects. You may report side effects to FDA at1-800-FDA-1088. Where should I keep my medication? Keep out of the reach of children. Store at room temperature between 15 and 30 degrees C (59 and 86 degrees F).Throw away any unused medicine after the expiration date. NOTE: This sheet is a summary. It may not cover all possible information. If you have questions about this medicine, talk to your doctor, pharmacist, orhealth care provider.  2022 Elsevier/Gold Standard (2016-03-15 17:15:34)

## 2020-10-20 LAB — CMP AND LIVER
ALT: 20 IU/L (ref 0–32)
AST: 20 IU/L (ref 0–40)
Albumin: 4.6 g/dL (ref 3.8–4.8)
Alkaline Phosphatase: 124 IU/L — ABNORMAL HIGH (ref 44–121)
BUN: 10 mg/dL (ref 8–27)
Bilirubin Total: 0.4 mg/dL (ref 0.0–1.2)
Bilirubin, Direct: <0.1 mg/dL (ref 0.00–0.40)
CO2: 23 mmol/L (ref 20–29)
Calcium: 9.4 mg/dL (ref 8.7–10.3)
Chloride: 101 mmol/L (ref 96–106)
Creatinine, Ser: 0.92 mg/dL (ref 0.57–1.00)
Glucose: 115 mg/dL — ABNORMAL HIGH (ref 65–99)
Potassium: 4.1 mmol/L (ref 3.5–5.2)
Sodium: 140 mmol/L (ref 134–144)
Total Protein: 6.9 g/dL (ref 6.0–8.5)
eGFR: 69 mL/min/{1.73_m2} (ref >59)

## 2020-10-20 LAB — CBC WITH DIFFERENTIAL
Basophils Absolute: 0.1 10*3/uL (ref 0.0–0.2)
Basos: 1 %
EOS (ABSOLUTE): 0.3 10*3/uL (ref 0.0–0.4)
Eos: 3 %
Hematocrit: 44.8 % (ref 34.0–46.6)
Hemoglobin: 15 g/dL (ref 11.1–15.9)
Immature Grans (Abs): 0 10*3/uL (ref 0.0–0.1)
Immature Granulocytes: 0 %
Lymphocytes Absolute: 2 10*3/uL (ref 0.7–3.1)
Lymphs: 20 %
MCH: 26.3 pg — ABNORMAL LOW (ref 26.6–33.0)
MCHC: 33.5 g/dL (ref 31.5–35.7)
MCV: 79 fL (ref 79–97)
Monocytes Absolute: 0.6 10*3/uL (ref 0.1–0.9)
Monocytes: 6 %
Neutrophils Absolute: 6.7 10*3/uL (ref 1.4–7.0)
Neutrophils: 70 %
RBC: 5.71 x10E6/uL — ABNORMAL HIGH (ref 3.77–5.28)
RDW: 13.4 % (ref 11.7–15.4)
WBC: 9.6 10*3/uL (ref 3.4–10.8)

## 2020-10-20 LAB — LIPID PANEL
Chol/HDL Ratio: 3.6 ratio (ref 0.0–4.4)
Cholesterol, Total: 221 mg/dL — ABNORMAL HIGH (ref 100–199)
HDL: 61 mg/dL (ref >39)
LDL Chol Calc (NIH): 140 mg/dL — ABNORMAL HIGH (ref 0–99)
Triglycerides: 114 mg/dL (ref 0–149)
VLDL Cholesterol Cal: 20 mg/dL (ref 5–40)

## 2020-10-20 LAB — TSH: TSH: 1.52 u[IU]/mL (ref 0.450–4.500)

## 2020-10-20 LAB — VITAMIN D 25 HYDROXY (VIT D DEFICIENCY, FRACTURES): Vit D, 25-Hydroxy: 20.1 ng/mL — ABNORMAL LOW (ref 30.0–100.0)

## 2020-11-02 ENCOUNTER — Encounter: Payer: Self-pay | Admitting: Obstetrics and Gynecology

## 2020-11-04 ENCOUNTER — Telehealth: Payer: Self-pay | Admitting: Family Medicine

## 2020-11-04 NOTE — Telephone Encounter (Signed)
Patient declined the Medicare Wellness Visit with [facility]   Stated the Dr Kenton Kingfisher completed at her last OBGYN appt

## 2020-11-19 ENCOUNTER — Other Ambulatory Visit: Payer: Self-pay | Admitting: Obstetrics & Gynecology

## 2021-01-06 DIAGNOSIS — Z1231 Encounter for screening mammogram for malignant neoplasm of breast: Secondary | ICD-10-CM | POA: Diagnosis not present

## 2021-01-06 LAB — HM MAMMOGRAPHY

## 2021-01-12 ENCOUNTER — Encounter: Payer: Self-pay | Admitting: Obstetrics & Gynecology

## 2021-04-05 ENCOUNTER — Other Ambulatory Visit: Payer: Self-pay

## 2021-04-05 ENCOUNTER — Ambulatory Visit: Payer: Self-pay | Admitting: *Deleted

## 2021-04-05 ENCOUNTER — Ambulatory Visit
Admission: EM | Admit: 2021-04-05 | Discharge: 2021-04-05 | Disposition: A | Discharge status: Home / Self Care | Payer: Medicare PPO | Attending: Emergency Medicine | Admitting: Emergency Medicine

## 2021-04-05 DIAGNOSIS — S61111A Laceration without foreign body of right thumb with damage to nail, initial encounter: Secondary | ICD-10-CM | POA: Diagnosis not present

## 2021-04-05 MED ORDER — IBUPROFEN 600 MG PO TABS: 600.0000 mg | ORAL_TABLET | Freq: Four times a day (QID) | ORAL | 0 refills | Status: DC | PRN

## 2021-04-05 NOTE — ED Triage Notes (Signed)
Pt c/o laceration to right thumb.   Pt reached up at 6am this morning and knocked over a picture frame and it cut into the tip of her thumb. The cut is from the nail to the top of the thumb.   Pt states that it is painful when the thumb is held in a downward position.

## 2021-04-05 NOTE — ED Provider Notes (Signed)
HPI  SUBJECTIVE:  Olivia Richards is a right-handed 66 y.o. female who presents with a laceration to the distal tip of her right thumb sustained on some glass earlier today.  Patient states that she was reaching up, and a picture above her bed fell. she shielded her face with her hands, and the frame cut her thumb, extending through the nail.  No foreign body.  She reports pain, particularly pain when holding her hand in a dependent position.  No limitation of motion.  She washed it out, applied peroxide and Neosporin.  Her tetanus is up-to-date.  PMD: [facility].   Past Medical History:  Diagnosis Date   DDD (degenerative disc disease), cervical    Family history of breast cancer    H/O colonoscopy 2017   History of kidney stones    Low blood pressure     Past Surgical History:  Procedure Laterality Date   CERVIX SURGERY  POLYP   COLONOSCOPY  2013   Dr Gustavo Lah- every 5 yrs   CYSTOCELE REPAIR N/A 04/24/2019   Procedure: ANTERIOR REPAIR (CYSTOCELE);  Surgeon: Gae Dry, MD;  Location: [facility];  Service: Gynecology;  Laterality: N/A;   FOOT SURGERY Left    lithotripsy     VAGINAL HYSTERECTOMY N/A 04/24/2019   Procedure: HYSTERECTOMY VAGINAL/ BSO;  Surgeon: Gae Dry, MD;  Location: [facility];  Service: Gynecology;  Laterality: N/A;    Family History  Problem Relation Age of Onset   Breast cancer Mother 84   COPD Mother    Hyperlipidemia Mother    Heart disease Father     Social History   Tobacco Use   Smoking status: Never   Smokeless tobacco: Never  Vaping Use   Vaping Use: Never used  Substance Use Topics   Alcohol use: No   Drug use: No    No current facility-administered medications for this encounter.  Current Outpatient Medications:    ibuprofen (ADVIL) 600 MG tablet, Take 1 tablet (600 mg total) by mouth every 6 (six) hours as needed., Disp: 30 tablet, Rfl: 0   raloxifene (EVISTA) 60 MG tablet, Take 1 tablet (60 mg total) by mouth  daily., Disp: 90 tablet, Rfl: 3  Allergies  Allergen Reactions   Levaquin [Levofloxacin In D5w] Itching and Other (See Comments)    blisters   Onion Other (See Comments)    Diarrhea and vomiting     ROS  As noted in HPI.   Physical Exam  BP (!) 167/82 (BP Location: Left Arm)    Pulse 94    Temp 98.4 F (36.9 C) (Oral)    Resp 18    Ht 5\' 5"  (1.651 m)    Wt 73 kg    SpO2 94%    BMI 26.79 kg/m   Constitutional: Well developed, well nourished, no acute distress Eyes:  EOMI, conjunctiva normal bilaterally HENT: Normocephalic, atraumatic,mucus membranes moist Respiratory: Normal inspiratory effort Cardiovascular: Normal rate GI: nondistended skin: See musculoskeletal exam Musculoskeletal: 2 cm clean linear laceration distal tip of right thumb with extension through the nail.  No foreign body seen.  Sensation grossly intact.  Flexion , extension intact.       Neurologic: Alert & oriented x 3, no focal neuro deficits Psychiatric: Speech and behavior appropriate   ED Course   Medications - No data to display  No orders of the defined types were placed in this encounter.   No results found for this or any previous visit (from the  past 24 hour(s)). No results found.  ED Clinical Impression  1. Laceration of right thumb without foreign body with damage to nail, initial encounter      ED Assessment/Plan  Procedure note: Had patient irrigate and scrub thumb extensively with chlorhexidine and tap water.  Then cleaned the base of the thumb with alcohol.  Performed a digital block with 1 cc of 1% plain lidocaine without epinephrine with adequate anesthesia.  Placed one 5-0 interrupted Prolene suture at the tip of the thumb with close approximation of wound edges and Dermabonded nail.  Placed dressing.  Patient tolerated procedure well  Do not think we need to send home on prophylactic antibiotics as this was a clean, noncontaminated cut, and it was washed out multiple  times.  Patient to return here for removal in 10 days, sooner for any signs of infection.  Tylenol ibuprofen together 3-4 times a day as needed for pain.  Keep dry for 48 to 72 hours.  Discussed MDM, treatment plan, and plan for follow-up with patient. patient agrees with plan.   Meds ordered this encounter  Medications   ibuprofen (ADVIL) 600 MG tablet    Sig: Take 1 tablet (600 mg total) by mouth every 6 (six) hours as needed.    Dispense:  30 tablet    Refill:  0      *This clinic note was created using L[employer]. Therefore, there may be occasional mistakes despite careful proofreading.  ?    Melynda Ripple, MD 04/05/21 1339

## 2021-04-05 NOTE — Telephone Encounter (Signed)
Noted pt was told to go to UC.  KP

## 2021-04-05 NOTE — Discharge Instructions (Addendum)
Keep this clean and dry for at least 48 to 72 hours.  Try not to let water get underneath the glue on your nail, it will cause it to come off.  No bacitracin or antibiotic ointments as it will cause the glue to come off.  Keep it covered while you are out and about doing things until it heals, but do give it some dry time so that it can heal.

## 2021-04-05 NOTE — Telephone Encounter (Signed)
°  Chief Complaint: laceration- R thumb Symptoms: pain Frequency:  Pertinent Negatives: Patient denies active bleeding now Disposition: [] ED /[x] Urgent Care (no appt availability in office) / [] Appointment(In office/virtual)/ []  [facility]/ [] Home Care/ [] Refused Recommended Disposition /[] [place]/ []  Follow-up with PCP Additional Notes: Advised UC- no procedures listed for PCP

## 2021-04-05 NOTE — Telephone Encounter (Signed)
Reason for Disposition  Skin is split open or gaping (or length > 1/2 inch or 12 mm on the skin, 1/4 inch or 6 mm on the face)  Answer Assessment - Initial Assessment Questions 1. APPEARANCE of INJURY: "What does the injury look like?"       2. SIZE: "How large is the cut?"      1/2", deep  3. BLEEDING: "Is it bleeding now?" If Yes, ask: "Is it difficult to stop?"      Bleeding has stopped with pressure 4. LOCATION: "Where is the injury located?"      Thumb-R 5. ONSET: "How long ago did the injury occur?"      6 am 6. MECHANISM: "Tell me how it happened."      Hit picture off wall- finger cut protecting herself 7. TETANUS: "When was the last tetanus booster?"     2015 8. PREGNANCY: "Is there any chance you are pregnant?" "When was your last menstrual period?"     *No Answer*  Protocols used: Cuts and Lacerations-A-AH

## 2021-04-19 ENCOUNTER — Telehealth: Payer: Self-pay | Admitting: Family Medicine

## 2021-04-19 NOTE — Telephone Encounter (Signed)
Pt called asking can clinical refil lotion ,  amonium lactate,be sent to her that she received from  podiatrist be sent to her, to avoid her having to go back thru podiatrist paying another copay

## 2021-05-01 ENCOUNTER — Encounter: Payer: Self-pay | Admitting: Emergency Medicine

## 2021-05-01 ENCOUNTER — Ambulatory Visit
Admission: EM | Admit: 2021-05-01 | Discharge: 2021-05-01 | Disposition: A | Discharge status: Home / Self Care | Payer: Medicare PPO | Attending: Emergency Medicine | Admitting: Emergency Medicine

## 2021-05-01 ENCOUNTER — Other Ambulatory Visit: Payer: Self-pay

## 2021-05-01 ENCOUNTER — Ambulatory Visit (INDEPENDENT_AMBULATORY_CARE_PROVIDER_SITE_OTHER): Payer: Medicare PPO

## 2021-05-01 DIAGNOSIS — N23 Unspecified renal colic: Secondary | ICD-10-CM | POA: Insufficient documentation

## 2021-05-01 DIAGNOSIS — R309 Painful micturition, unspecified: Secondary | ICD-10-CM

## 2021-05-01 DIAGNOSIS — R310 Gross hematuria: Secondary | ICD-10-CM | POA: Insufficient documentation

## 2021-05-01 DIAGNOSIS — R3915 Urgency of urination: Secondary | ICD-10-CM | POA: Diagnosis not present

## 2021-05-01 DIAGNOSIS — R109 Unspecified abdominal pain: Secondary | ICD-10-CM | POA: Diagnosis not present

## 2021-05-01 LAB — URINALYSIS, MICROSCOPIC (REFLEX): RBC / HPF: >50 RBC/hpf (ref 0–5)

## 2021-05-01 LAB — URINALYSIS, ROUTINE W REFLEX MICROSCOPIC
Bilirubin Urine: NEGATIVE
Glucose, UA: NEGATIVE mg/dL
Ketones, ur: NEGATIVE mg/dL
Leukocytes,Ua: NEGATIVE
Nitrite: NEGATIVE
Protein, ur: 30 mg/dL — AB
Specific Gravity, Urine: 1.025 (ref 1.005–1.030)
pH: 6 (ref 5.0–8.0)

## 2021-05-01 MED ORDER — ONDANSETRON 8 MG PO TBDP: ORAL_TABLET | ORAL | 0 refills | Status: DC

## 2021-05-01 MED ORDER — IBUPROFEN 600 MG PO TABS: 600.0000 mg | ORAL_TABLET | Freq: Four times a day (QID) | ORAL | 0 refills | Status: DC | PRN

## 2021-05-01 MED ORDER — TAMSULOSIN HCL 0.4 MG PO CAPS: 0.4000 mg | ORAL_CAPSULE | Freq: Every day | ORAL | 0 refills | Status: AC

## 2021-05-01 NOTE — ED Triage Notes (Signed)
Patient reports urinary urgency, burning when urinating and left flank pain that started around 6 am this morning.  Patient denies N/V.  ?

## 2021-05-01 NOTE — Discharge Instructions (Addendum)
Your x-ray was negative for stone, but x-ray is not very sensitive or specific picking up small kidney stones.  I am sending you home with 600 mg of ibuprofen to take with 1000 mg of Tylenol 3-4 times a day as needed for pain, this will help move the kidney stone along.  Flomax will also help.  Zofran in case you get nauseous again.  Strain your urine.  Please follow-up with urology as needed. ?

## 2021-05-01 NOTE — ED Provider Notes (Signed)
HPI ? ?SUBJECTIVE: ? ?Olivia Richards is a 66 y.o. female who presents with the acute onset of dysuria, urgency, frequency and a single episode of hematuria starting this morning.  She reports sudden constant, severe, dull, nonmigratory, nonradiating achy left kidney/back pain and nausea that has now largely resolved.  No vomiting, fevers, abdominal pain, vaginal odor or discharge.  No antipyretic in the past 6 hours.  She states this is identical to previous nephrolithiasis.  No aggravating or alleviating factors.  She has not tried anything for this.  She has a past medical history of obstructing nephrolithiasis that required lithotripsy, UTI.  No history of pyelonephritis.  She is status post hysterectomy/oophorectomy. ? ? ?Past Medical History:  ?Diagnosis Date  ? DDD (degenerative disc disease), cervical   ? Family history of breast cancer   ? H/O colonoscopy 2017  ? History of kidney stones   ? Low blood pressure   ? ? ?Past Surgical History:  ?Procedure Laterality Date  ? CERVIX SURGERY  POLYP  ? COLONOSCOPY  2013  ? Dr Gustavo Lah- every 5 yrs  ? CYSTOCELE REPAIR N/A 04/24/2019  ? Procedure: ANTERIOR REPAIR (CYSTOCELE);  Surgeon: Gae Dry, MD;  Location: [facility];  Service: Gynecology;  Laterality: N/A;  ? FOOT SURGERY Left   ? lithotripsy    ? VAGINAL HYSTERECTOMY N/A 04/24/2019  ? Procedure: HYSTERECTOMY VAGINAL/ BSO;  Surgeon: Gae Dry, MD;  Location: [facility];  Service: Gynecology;  Laterality: N/A;  ? ? ?Family History  ?Problem Relation Age of Onset  ? Breast cancer Mother 50  ? COPD Mother   ? Hyperlipidemia Mother   ? Heart disease Father   ? ? ?Social History  ? ?Tobacco Use  ? Smoking status: Never  ? Smokeless tobacco: Never  ?Vaping Use  ? Vaping Use: Never used  ?Substance Use Topics  ? Alcohol use: No  ? Drug use: No  ? ? ?No current facility-administered medications for this encounter. ? ?Current Outpatient Medications:  ?  ibuprofen (ADVIL) 600 MG tablet, Take 1 tablet (600 mg  total) by mouth every 6 (six) hours as needed., Disp: 30 tablet, Rfl: 0 ?  ondansetron (ZOFRAN-ODT) 8 MG disintegrating tablet, 1/2- 1 tablet q 8 hr prn nausea, vomiting, Disp: 20 tablet, Rfl: 0 ?  tamsulosin (FLOMAX) 0.4 MG CAPS capsule, Take 1 capsule (0.4 mg total) by mouth at bedtime for 7 days., Disp: 7 capsule, Rfl: 0 ?  raloxifene (EVISTA) 60 MG tablet, Take 1 tablet (60 mg total) by mouth daily., Disp: 90 tablet, Rfl: 3 ? ?Allergies  ?Allergen Reactions  ? Levaquin [Levofloxacin In D5w] Itching and Other (See Comments)  ?  blisters  ? Onion Other (See Comments)  ?  Diarrhea and vomiting  ? ? ? ?ROS ? ?As noted in HPI.  ? ?Physical Exam ? ?BP (!) 143/62 (BP Location: Left Arm)   Pulse 70   Temp 98 ?F (36.7 ?C) (Oral)   Resp 14   Ht '5\' 5"'$  (1.651 m)   Wt 73.5 kg   SpO2 100%   BMI 26.96 kg/m?  ? ?Constitutional: Well developed, well nourished, no acute distress ?Eyes:  EOMI, conjunctiva normal bilaterally ?HENT: Normocephalic, atraumatic,mucus membranes moist ?Respiratory: Normal inspiratory effort ?Cardiovascular: Normal rate ?GI: nondistended soft.  No suprapubic, flank tenderness. ?Back: No CVAT, lumbar tenderness, paralumbar tenderness ?skin: No rash, skin intact ?Musculoskeletal: no deformities ?Neurologic: Alert & oriented x 3, no focal neuro deficits ?Psychiatric: Speech and behavior appropriate ? ? ?ED Course ? ? ?  Medications - No data to display ? ?Orders Placed This Encounter  ?Procedures  ? Urine Culture  ?  Standing Status:   Standing  ?  Number of Occurrences:   1  ?  Order Specific Question:   Indication  ?  Answer:   Acute gross hematuria  ? DG Abd 1 View  ?  Standing Status:   Standing  ?  Number of Occurrences:   1  ?  Order Specific Question:   Reason for Exam (SYMPTOM  OR DIAGNOSIS REQUIRED)  ?  Answer:   r/o left-sided nephrolithiasis  ? Urinalysis, Routine w reflex microscopic Urine, Clean Catch  ?  Standing Status:   Standing  ?  Number of Occurrences:   1  ? Urinalysis, Microscopic  (reflex)  ?  Standing Status:   Standing  ?  Number of Occurrences:   1  ? Strain all urine  ?  Send pt home with urine strainer  ? ? ?Results for orders placed or performed during the hospital encounter of 05/01/21 (from the past 24 hour(s))  ?Urinalysis, Routine w reflex microscopic Urine, Clean Catch     Status: Abnormal  ? Collection Time: 05/01/21  8:19 AM  ?Result Value Ref Range  ? Color, Urine BROWN (A) YELLOW  ? APPearance CLOUDY (A) CLEAR  ? Specific Gravity, Urine 1.025 1.005 - 1.030  ? pH 6.0 5.0 - 8.0  ? Glucose, UA NEGATIVE NEGATIVE mg/dL  ? Hgb urine dipstick LARGE (A) NEGATIVE  ? Bilirubin Urine NEGATIVE NEGATIVE  ? Ketones, ur NEGATIVE NEGATIVE mg/dL  ? Protein, ur 30 (A) NEGATIVE mg/dL  ? Nitrite NEGATIVE NEGATIVE  ? Leukocytes,Ua NEGATIVE NEGATIVE  ?Urinalysis, Microscopic (reflex)     Status: Abnormal  ? Collection Time: 05/01/21  8:19 AM  ?Result Value Ref Range  ? RBC / HPF >50 0 - 5 RBC/hpf  ? WBC, UA 0-5 0 - 5 WBC/hpf  ? Bacteria, UA RARE (A) NONE SEEN  ? Squamous Epithelial / LPF 0-5 0 - 5  ? Non Squamous Epithelial PRESENT (A) NONE SEEN  ? ?DG Abd 1 View ? ?Result Date: 05/01/2021 ?CLINICAL DATA:  66 year old female with urinary urgency. Burning with urination. Left-sided flank pain. EXAM: ABDOMEN - 1 VIEW COMPARISON:  No priors. FINDINGS: The bowel gas pattern is normal. No radio-opaque calculi or other significant radiographic abnormality are seen. IMPRESSION: Negative. Electronically Signed   By: Vinnie Langton M.D.   On: 05/01/2021 08:56   ? ?ED Clinical Impression ? ?1. Renal colic on left side   ?2. Gross hematuria   ?  ? ?ED Assessment/Plan ? ?Suspect nephrolithiasis.  Patient states that she is currently feeling much better and declined nausea/pain medication.  CT is not available here today.  Will check KUB to evaluate for large stone although discussed with patient that it is not a very sensitive or specific test, and a UA. ? ?Reviewed imaging independently. No radiopaque calculi  are noted.  see radiology report for full details. ? ?Suspect nephrolithiasis.  UA has large hematuria, proteinuria.  It has rare bacteria, but is a slightly contaminated sample.  No esterase, nitrates.  However, will send urine off for culture to confirm absence of UTI.  KUB is negative for radiopaque calculi.  Sending home with Tylenol/ibuprofen, Flomax, Zofran, strainer.  If pain is not controlled with the Tylenol and ibuprofen, patient will need to go to the ED for CT to rule out obstructing nephrolithiasis.  Follow-up with urology as needed. ? ?Discussed  labs, imaging, MDM, treatment plan, and plan for follow-up with patient. Discussed sn/sx that should prompt return to the ED. patient agrees with plan.  ? ?Meds ordered this encounter  ?Medications  ? ibuprofen (ADVIL) 600 MG tablet  ?  Sig: Take 1 tablet (600 mg total) by mouth every 6 (six) hours as needed.  ?  Dispense:  30 tablet  ?  Refill:  0  ? tamsulosin (FLOMAX) 0.4 MG CAPS capsule  ?  Sig: Take 1 capsule (0.4 mg total) by mouth at bedtime for 7 days.  ?  Dispense:  7 capsule  ?  Refill:  0  ? ondansetron (ZOFRAN-ODT) 8 MG disintegrating tablet  ?  Sig: 1/2- 1 tablet q 8 hr prn nausea, vomiting  ?  Dispense:  20 tablet  ?  Refill:  0  ? ? ? ? ?*This clinic note was created using [employer]. Therefore, there may be occasional mistakes despite careful proofreading. ? ?? ? ?  ?Melynda Ripple, MD ?05/01/21 [phone] ? ?

## 2021-05-02 LAB — URINE CULTURE: Culture: <10000 — AB

## 2021-09-14 DIAGNOSIS — F419 Anxiety disorder, unspecified: Secondary | ICD-10-CM | POA: Diagnosis not present

## 2021-09-14 DIAGNOSIS — Z86010 Personal history of colonic polyps: Secondary | ICD-10-CM | POA: Diagnosis not present

## 2021-10-06 DIAGNOSIS — K640 First degree hemorrhoids: Secondary | ICD-10-CM | POA: Diagnosis not present

## 2021-10-06 DIAGNOSIS — Z86010 Personal history of colonic polyps: Secondary | ICD-10-CM | POA: Diagnosis not present

## 2021-10-06 LAB — HM COLONOSCOPY

## 2021-10-19 LAB — HM PAP SMEAR: HM Pap smear: NORMAL

## 2021-11-01 ENCOUNTER — Encounter: Payer: Self-pay | Admitting: Family Medicine

## 2021-11-01 ENCOUNTER — Ambulatory Visit (INDEPENDENT_AMBULATORY_CARE_PROVIDER_SITE_OTHER): Payer: Medicare PPO | Admitting: Family Medicine

## 2021-11-01 VITALS — BP 130/80 | HR 80 | Ht 65.0 in | Wt 165.0 lb

## 2021-11-01 DIAGNOSIS — R718 Other abnormality of red blood cells: Secondary | ICD-10-CM | POA: Diagnosis not present

## 2021-11-01 DIAGNOSIS — R748 Abnormal levels of other serum enzymes: Secondary | ICD-10-CM | POA: Diagnosis not present

## 2021-11-01 DIAGNOSIS — R739 Hyperglycemia, unspecified: Secondary | ICD-10-CM

## 2021-11-01 DIAGNOSIS — E118 Type 2 diabetes mellitus with unspecified complications: Secondary | ICD-10-CM | POA: Diagnosis not present

## 2021-11-01 DIAGNOSIS — Z0000 Encounter for general adult medical examination without abnormal findings: Secondary | ICD-10-CM | POA: Diagnosis not present

## 2021-11-01 DIAGNOSIS — E782 Mixed hyperlipidemia: Secondary | ICD-10-CM | POA: Diagnosis not present

## 2021-11-01 DIAGNOSIS — R7309 Other abnormal glucose: Secondary | ICD-10-CM | POA: Diagnosis not present

## 2021-11-01 DIAGNOSIS — E785 Hyperlipidemia, unspecified: Secondary | ICD-10-CM | POA: Diagnosis not present

## 2021-11-01 LAB — POCT URINALYSIS DIPSTICK
Bilirubin, UA: NEGATIVE
Blood, UA: NEGATIVE
Glucose, UA: NEGATIVE
Ketones, UA: NEGATIVE
Leukocytes, UA: NEGATIVE
Nitrite, UA: NEGATIVE
Protein, UA: NEGATIVE
Spec Grav, UA: 1.02 (ref 1.010–1.025)
Urobilinogen, UA: 0.2 E.U./dL
pH, UA: 6 (ref 5.0–8.0)

## 2021-11-01 NOTE — Progress Notes (Signed)
Date:  11/01/2021   Name:  Olivia Richards   DOB:  [birth year]   MRN:  [id number]   Chief Complaint: Annual Exam and Prediabetes  Patient is a 66 year old female who presents for a comprehensive physical exam. The patient reports the following problems: none. Health maintenance has been reviewed up to date.Olivia Richards is a 66 y.o. female who presents today for her Complete Annual Exam. She feels well. She reports exercising daily. She reports she is sleeping well.  Immunizations are reviewed and recommendations provided.   Age appropriate screening tests are discussed. Counseling given for risk factor reduction interventions.       Lab Results  Component Value Date   NA 140 10/19/2020   K 4.1 10/19/2020   CO2 23 10/19/2020   GLUCOSE 115 (H) 10/19/2020   BUN 10 10/19/2020   CREATININE 0.92 10/19/2020   CALCIUM 9.4 10/19/2020   EGFR 69 10/19/2020   GFRNONAA >60 04/22/2019   Lab Results  Component Value Date   CHOL 221 (H) 10/19/2020   HDL 61 10/19/2020   LDLCALC 140 (H) 10/19/2020   TRIG 114 10/19/2020   CHOLHDL 3.6 10/19/2020   Lab Results  Component Value Date   TSH 1.520 10/19/2020   No results found for: "HGBA1C" Lab Results  Component Value Date   WBC 9.6 10/19/2020   HGB 15.0 10/19/2020   HCT 44.8 10/19/2020   MCV 79 10/19/2020   PLT 314 04/22/2019   Lab Results  Component Value Date   ALT 20 10/19/2020   AST 20 10/19/2020   ALKPHOS 124 (H) 10/19/2020   BILITOT 0.4 10/19/2020   Lab Results  Component Value Date   VD25OH 20.1 (L) 10/19/2020     Review of Systems  Constitutional: Negative.  Negative for chills, fatigue, fever and unexpected weight change.  HENT:  Negative for congestion, ear discharge, ear pain, rhinorrhea, sinus pressure, sneezing, sore throat and trouble swallowing.   Eyes:  Negative for visual disturbance.  Respiratory:  Negative for cough, shortness of breath, wheezing and stridor.   Cardiovascular:  Negative for chest  pain, palpitations and leg swelling.  Gastrointestinal:  Negative for abdominal pain, blood in stool, constipation, diarrhea and nausea.  Endocrine: Negative for polydipsia and polyuria.  Genitourinary:  Negative for difficulty urinating, dysuria, flank pain, frequency, hematuria, urgency and vaginal discharge.  Musculoskeletal:  Negative for arthralgias, back pain and myalgias.  Skin:  Negative for rash.  Neurological:  Negative for dizziness, weakness and headaches.  Hematological:  Negative for adenopathy. Does not bruise/bleed easily.  Psychiatric/Behavioral:  Negative for dysphoric mood. The patient is not nervous/anxious.     Patient Active Problem List   Diagnosis Date Noted   S/P vaginal hysterectomy 05/07/2019   Uterine prolapse 03/18/2019   Dysuria 06/12/2018   Vulvar candidiasis 06/12/2018    Allergies  Allergen Reactions   Levaquin [Levofloxacin In D5w] Itching and Other (See Comments)    blisters   Onion Other (See Comments)    Diarrhea and vomiting    Past Surgical History:  Procedure Laterality Date   CERVIX SURGERY  POLYP   COLONOSCOPY  2013   Dr Gustavo Lah- every 5 yrs   CYSTOCELE REPAIR N/A 04/24/2019   Procedure: ANTERIOR REPAIR (CYSTOCELE);  Surgeon: Gae Dry, MD;  Location: [facility];  Service: Gynecology;  Laterality: N/A;   FOOT SURGERY Left    lithotripsy     VAGINAL HYSTERECTOMY N/A 04/24/2019   Procedure: HYSTERECTOMY VAGINAL/ BSO;  Surgeon: Kenton Kingfisher,  Linton Ham, MD;  Location: [facility];  Service: Gynecology;  Laterality: N/A;    Social History   Tobacco Use   Smoking status: Never   Smokeless tobacco: Never  Vaping Use   Vaping Use: Never used  Substance Use Topics   Alcohol use: No   Drug use: No     Medication list has been reviewed and updated.  No outpatient medications have been marked as taking for the 11/01/21 encounter (Office Visit) with Juline Patch, MD.       11/01/2021   10:12 AM 07/06/2020   10:18 AM 06/30/2019    2:15  PM  GAD 7 : Generalized Anxiety Score  Nervous, Anxious, on Edge 0 3 1  Control/stop worrying 0 3 0  Worry too much - different things 0 3 2  Trouble relaxing 0 3 0  Restless 0 3 0  Easily annoyed or irritable 0 3 0  Afraid - awful might happen 0 3 0  Total GAD 7 Score 0 21 3  Anxiety Difficulty Not difficult at all Not difficult at all Not difficult at all       11/01/2021   10:11 AM 07/06/2020   10:16 AM 06/30/2019    2:15 PM  Depression screen PHQ 2/9  Decreased Interest 0 3 0  Down, Depressed, Hopeless 0 3 0  PHQ - 2 Score 0 6 0  Altered sleeping 0 0 0  Tired, decreased energy 0 0 0  Change in appetite 0 0 0  Feeling bad or failure about yourself  0 1 0  Trouble concentrating 0 1 0  Moving slowly or fidgety/restless 0 3 0  Suicidal thoughts 0 1 0  PHQ-9 Score 0 12 0  Difficult doing work/chores Not difficult at all Somewhat difficult     BP Readings from Last 3 Encounters:  11/01/21 130/80  05/01/21 (!) 143/62  04/05/21 (!) 167/82    Physical Exam Vitals and nursing note reviewed. Exam conducted with a chaperone present.  Constitutional:      General: She is not in acute distress.    Appearance: Normal appearance. She is well-groomed. She is not diaphoretic.  HENT:     Head: Normocephalic and atraumatic.     Jaw: There is normal jaw occlusion.     Right Ear: Hearing, tympanic membrane, ear canal and external ear normal.     Left Ear: Hearing, tympanic membrane, ear canal and external ear normal.     Nose: Nose normal.     Mouth/Throat:     Lips: Pink.     Mouth: Mucous membranes are moist.     Dentition: Normal dentition.     Tongue: No lesions.     Palate: No mass.     Pharynx: Oropharynx is clear. No oropharyngeal exudate or posterior oropharyngeal erythema.  Eyes:     General: Lids are normal. Vision grossly intact. Gaze aligned appropriately.        Right eye: No discharge.        Left eye: No discharge.     Extraocular Movements: Extraocular  movements intact.     Conjunctiva/sclera: Conjunctivae normal.     Pupils: Pupils are equal, round, and reactive to light.  Neck:     Thyroid: No thyroid mass, thyromegaly or thyroid tenderness.     Vascular: Normal carotid pulses. No carotid bruit, hepatojugular reflux or JVD.     Trachea: Trachea and phonation normal.  Cardiovascular:     Rate and Rhythm: Normal rate  and regular rhythm.     Pulses: Normal pulses.     Heart sounds: Normal heart sounds, S1 normal and S2 normal. No murmur heard.    No systolic murmur is present.     No diastolic murmur is present.     No friction rub. No gallop. No S3 or S4 sounds.  Pulmonary:     Effort: Pulmonary effort is normal.     Breath sounds: Normal breath sounds. No decreased breath sounds, wheezing, rhonchi or rales.  Chest:  Breasts:    Right: Normal. No swelling, bleeding, inverted nipple, mass, nipple discharge, skin change or tenderness.     Left: Normal. No bleeding, inverted nipple, mass, nipple discharge, skin change or tenderness.  Abdominal:     General: Bowel sounds are normal.     Palpations: Abdomen is soft. There is no hepatomegaly, splenomegaly or mass.     Tenderness: There is no abdominal tenderness. There is no guarding or rebound.  Musculoskeletal:        General: Normal range of motion.     Cervical back: Normal, full passive range of motion without pain, normal range of motion and neck supple.     Thoracic back: Normal.     Lumbar back: Normal.     Right lower leg: No edema.     Left lower leg: No edema.  Lymphadenopathy:     Head:     Right side of head: No submental, submandibular or tonsillar adenopathy.     Left side of head: No submental, submandibular or tonsillar adenopathy.     Cervical: No cervical adenopathy.     Right cervical: No superficial, deep or posterior cervical adenopathy.    Left cervical: No superficial, deep or posterior cervical adenopathy.     Upper Body:     Right upper body: No  supraclavicular or axillary adenopathy.     Left upper body: No supraclavicular or axillary adenopathy.     Lower Body: No right inguinal adenopathy. No left inguinal adenopathy.  Skin:    General: Skin is warm and dry.     Capillary Refill: Capillary refill takes less than 2 seconds.  Neurological:     Mental Status: She is alert.     Cranial Nerves: Cranial nerves 2-12 are intact.     Motor: Motor function is intact.     Deep Tendon Reflexes: Reflexes are normal and symmetric.  Psychiatric:        Behavior: Behavior is cooperative.     Wt Readings from Last 3 Encounters:  11/01/21 165 lb (74.8 kg)  05/01/21 162 lb (73.5 kg)  04/05/21 161 lb (73 kg)    BP 130/80   Pulse 80   Ht 5' 5"  (1.651 m)   Wt 165 lb (74.8 kg)   BMI 27.46 kg/m   Assessment and Plan:  1. Annual physical exam No subjective/objective concerns noted during history of present illness, review of past medical history medications, review of systems, and physical exam.  We will obtain A1c lipid panel and comprehensive metabolic panel for surveillance. - HgB A1c - Lipid Panel With LDL/HDL Ratio - POCT urinalysis dipstick - Comprehensive metabolic panel    Otilio Miu, MD

## 2021-11-02 ENCOUNTER — Other Ambulatory Visit: Payer: Self-pay

## 2021-11-02 DIAGNOSIS — E782 Mixed hyperlipidemia: Secondary | ICD-10-CM

## 2021-11-02 DIAGNOSIS — E785 Hyperlipidemia, unspecified: Secondary | ICD-10-CM

## 2021-11-02 LAB — CBC WITH DIFFERENTIAL/PLATELET
Basophils Absolute: 0.1 10*3/uL (ref 0.0–0.2)
Basos: 1 %
EOS (ABSOLUTE): 0.2 10*3/uL (ref 0.0–0.4)
Eos: 2 %
Hematocrit: 43.1 % (ref 34.0–46.6)
Hemoglobin: 14.2 g/dL (ref 11.1–15.9)
Immature Grans (Abs): 0 10*3/uL (ref 0.0–0.1)
Immature Granulocytes: 0 %
Lymphocytes Absolute: 1.8 10*3/uL (ref 0.7–3.1)
Lymphs: 19 %
MCH: 25.8 pg — ABNORMAL LOW (ref 26.6–33.0)
MCHC: 32.9 g/dL (ref 31.5–35.7)
MCV: 78 fL — ABNORMAL LOW (ref 79–97)
Monocytes Absolute: 0.6 10*3/uL (ref 0.1–0.9)
Monocytes: 6 %
Neutrophils Absolute: 6.9 10*3/uL (ref 1.4–7.0)
Neutrophils: 72 %
Platelets: 312 10*3/uL (ref 150–450)
RBC: 5.51 x10E6/uL — ABNORMAL HIGH (ref 3.77–5.28)
RDW: 13.5 % (ref 11.7–15.4)
WBC: 9.6 10*3/uL (ref 3.4–10.8)

## 2021-11-02 LAB — LIPID PANEL WITH LDL/HDL RATIO
Cholesterol, Total: 222 mg/dL — ABNORMAL HIGH (ref 100–199)
HDL: 63 mg/dL (ref >39)
LDL Chol Calc (NIH): 143 mg/dL — ABNORMAL HIGH (ref 0–99)
LDL/HDL Ratio: 2.3 ratio (ref 0.0–3.2)
Triglycerides: 89 mg/dL (ref 0–149)
VLDL Cholesterol Cal: 16 mg/dL (ref 5–40)

## 2021-11-02 LAB — COMPREHENSIVE METABOLIC PANEL
ALT: 17 IU/L (ref 0–32)
AST: 20 IU/L (ref 0–40)
Albumin/Globulin Ratio: 2.1 (ref 1.2–2.2)
Albumin: 4.7 g/dL (ref 3.9–4.9)
Alkaline Phosphatase: 111 IU/L (ref 44–121)
BUN/Creatinine Ratio: 11 — ABNORMAL LOW (ref 12–28)
BUN: 11 mg/dL (ref 8–27)
Bilirubin Total: 0.5 mg/dL (ref 0.0–1.2)
CO2: 23 mmol/L (ref 20–29)
Calcium: 9.5 mg/dL (ref 8.7–10.3)
Chloride: 102 mmol/L (ref 96–106)
Creatinine, Ser: 0.96 mg/dL (ref 0.57–1.00)
Globulin, Total: 2.2 g/dL (ref 1.5–4.5)
Glucose: 123 mg/dL — ABNORMAL HIGH (ref 70–99)
Potassium: 4.2 mmol/L (ref 3.5–5.2)
Sodium: 139 mmol/L (ref 134–144)
Total Protein: 6.9 g/dL (ref 6.0–8.5)
eGFR: 65 mL/min/{1.73_m2} (ref >59)

## 2021-11-02 LAB — HEMOGLOBIN A1C
Est. average glucose Bld gHb Est-mCnc: 143 mg/dL
Hgb A1c MFr Bld: 6.6 % — ABNORMAL HIGH (ref 4.8–5.6)

## 2021-11-02 MED ORDER — ROSUVASTATIN CALCIUM 5 MG PO TABS: 5.0000 mg | ORAL_TABLET | Freq: Every day | ORAL | 1 refills | Status: DC

## 2021-11-02 NOTE — Progress Notes (Signed)
Sent in lipid medicine

## 2021-11-22 DIAGNOSIS — D485 Neoplasm of uncertain behavior of skin: Secondary | ICD-10-CM | POA: Diagnosis not present

## 2021-11-22 DIAGNOSIS — L570 Actinic keratosis: Secondary | ICD-10-CM | POA: Diagnosis not present

## 2021-11-22 DIAGNOSIS — L578 Other skin changes due to chronic exposure to nonionizing radiation: Secondary | ICD-10-CM | POA: Diagnosis not present

## 2021-11-22 DIAGNOSIS — Z872 Personal history of diseases of the skin and subcutaneous tissue: Secondary | ICD-10-CM | POA: Diagnosis not present

## 2021-11-22 DIAGNOSIS — D225 Melanocytic nevi of trunk: Secondary | ICD-10-CM | POA: Diagnosis not present

## 2021-12-05 DIAGNOSIS — R7303 Prediabetes: Secondary | ICD-10-CM | POA: Insufficient documentation

## 2021-12-05 DIAGNOSIS — E782 Mixed hyperlipidemia: Secondary | ICD-10-CM | POA: Insufficient documentation

## 2021-12-05 DIAGNOSIS — E118 Type 2 diabetes mellitus with unspecified complications: Secondary | ICD-10-CM | POA: Insufficient documentation

## 2022-01-03 DIAGNOSIS — D235 Other benign neoplasm of skin of trunk: Secondary | ICD-10-CM | POA: Diagnosis not present

## 2022-01-03 DIAGNOSIS — L988 Other specified disorders of the skin and subcutaneous tissue: Secondary | ICD-10-CM | POA: Diagnosis not present

## 2022-01-18 DIAGNOSIS — L570 Actinic keratosis: Secondary | ICD-10-CM | POA: Diagnosis not present

## 2022-01-18 DIAGNOSIS — L905 Scar conditions and fibrosis of skin: Secondary | ICD-10-CM | POA: Diagnosis not present

## 2022-02-17 DIAGNOSIS — Z1231 Encounter for screening mammogram for malignant neoplasm of breast: Secondary | ICD-10-CM | POA: Diagnosis not present

## 2022-02-17 LAB — HM MAMMOGRAPHY

## 2022-02-22 ENCOUNTER — Encounter: Payer: Self-pay | Admitting: Family Medicine

## 2022-02-22 ENCOUNTER — Telehealth: Payer: Self-pay | Admitting: Family Medicine

## 2022-02-22 NOTE — Telephone Encounter (Signed)
Called pt let her know that she needs further imaging that someone should call her to schedule. Let pt now she could call to schedule as well if she would like.  KP

## 2022-02-22 NOTE — Telephone Encounter (Signed)
Copied from [place] [phone]. Topic: General - Other >> Feb 22, 2022 12:45 PM Everette C wrote: Luiz Iron for CRM: The patient would like to be contacted when possible by a member of clinical staff to review their imaging from  02/17/22  Please contact further when possible

## 2022-02-22 NOTE — Telephone Encounter (Signed)
Diagnostic imaging needed. Please order.  KP

## 2022-02-23 DIAGNOSIS — R92321 Mammographic fibroglandular density, right breast: Secondary | ICD-10-CM | POA: Diagnosis not present

## 2022-02-23 DIAGNOSIS — R921 Mammographic calcification found on diagnostic imaging of breast: Secondary | ICD-10-CM | POA: Diagnosis not present

## 2022-02-23 NOTE — Telephone Encounter (Signed)
Pt called in to follow up. Advised pt of message below from provider and CMA. Pt says that she received a call from [facility] stating that it is time for mammogram.. pt says that she doesn't recall who placed order however she was told by imaging to reach out to her PCP to have biopsy order placed.    Pt would like to have order placed at [facility]: [phone] - to assist pt further.

## 2022-02-23 NOTE — Telephone Encounter (Addendum)
Pt is calling to Olivia Richards regarding orders for a biopsy. Pt orginally had a mamogram at [facility] and then found calification. [facility] is wanting the patient to go to [facility] for a biopsy. Pts Doctor is Dr. Carlena Bjornstad at [facility] [phone]. Dr. Carlena Bjornstad stated that she would forward all imaging to Dr. Ronnald Ramp.   Patient Olivia Richards completed [place]  [phone]

## 2022-03-03 ENCOUNTER — Telehealth: Payer: Self-pay | Admitting: Family Medicine

## 2022-03-03 NOTE — Telephone Encounter (Signed)
Copied from [place]. Topic: General - Other >> Mar 03, 2022 12:44 PM Olivia Richards wrote: Reason for CRM: Patient called in stating  is getting messages from [facility] saying she needs to go to [facility] she thought she was supposed to be going [facility].  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

## 2022-03-07 ENCOUNTER — Ambulatory Visit: Payer: Medicare PPO | Admitting: Family Medicine

## 2022-03-07 ENCOUNTER — Encounter: Payer: Self-pay | Admitting: Family Medicine

## 2022-03-07 VITALS — BP 110/80 | HR 82 | Ht 65.0 in | Wt 162.0 lb

## 2022-03-07 DIAGNOSIS — E118 Type 2 diabetes mellitus with unspecified complications: Secondary | ICD-10-CM | POA: Diagnosis not present

## 2022-03-07 DIAGNOSIS — E782 Mixed hyperlipidemia: Secondary | ICD-10-CM | POA: Diagnosis not present

## 2022-03-07 DIAGNOSIS — E785 Hyperlipidemia, unspecified: Secondary | ICD-10-CM | POA: Diagnosis not present

## 2022-03-07 NOTE — Progress Notes (Signed)
Date:  03/07/2022   Name:  Olivia Richards   DOB:  1955/09/02   MRN:  [id number]   Chief Complaint: Hyperlipidemia and Diabetes  Hyperlipidemia This is a chronic problem. The current episode started more than 1 year ago. The problem is controlled. Recent lipid tests were reviewed and are normal. She has no history of chronic renal disease, diabetes, hypothyroidism, liver disease, obesity or nephrotic syndrome. Pertinent negatives include no chest pain, focal sensory loss, focal weakness, leg pain, myalgias or shortness of breath. Current antihyperlipidemic treatment includes diet change. The current treatment provides mild improvement of lipids.  Diabetes Pertinent negatives for hypoglycemia include no dizziness, headaches or nervousness/anxiousness. Pertinent negatives for diabetes include no chest pain, no fatigue and no weakness.    Lab Results  Component Value Date   NA 139 11/01/2021   K 4.2 11/01/2021   CO2 23 11/01/2021   GLUCOSE 123 (H) 11/01/2021   BUN 11 11/01/2021   CREATININE 0.96 11/01/2021   CALCIUM 9.5 11/01/2021   EGFR 65 11/01/2021   GFRNONAA >60 04/22/2019   Lab Results  Component Value Date   CHOL 222 (H) 11/01/2021   HDL 63 11/01/2021   LDLCALC 143 (H) 11/01/2021   TRIG 89 11/01/2021   CHOLHDL 3.6 10/19/2020   Lab Results  Component Value Date   TSH 1.520 10/19/2020   Lab Results  Component Value Date   HGBA1C 6.6 (H) 11/01/2021   Lab Results  Component Value Date   WBC 9.6 11/01/2021   HGB 14.2 11/01/2021   HCT 43.1 11/01/2021   MCV 78 (L) 11/01/2021   PLT 312 11/01/2021   Lab Results  Component Value Date   ALT 17 11/01/2021   AST 20 11/01/2021   ALKPHOS 111 11/01/2021   BILITOT 0.5 11/01/2021   Lab Results  Component Value Date   VD25OH 20.1 (L) 10/19/2020     Review of Systems  Constitutional: Negative.  Negative for chills, fatigue, fever and unexpected weight change.  HENT:  Negative for congestion, ear discharge, ear  pain, rhinorrhea, sinus pressure, sneezing and sore throat.   Respiratory:  Negative for cough, shortness of breath, wheezing and stridor.   Cardiovascular:  Negative for chest pain.  Gastrointestinal:  Negative for abdominal pain, blood in stool, constipation, diarrhea and nausea.  Genitourinary:  Negative for dysuria, flank pain, frequency, hematuria, urgency and vaginal discharge.  Musculoskeletal:  Negative for arthralgias, back pain and myalgias.  Skin:  Negative for rash.  Neurological:  Negative for dizziness, focal weakness, weakness and headaches.  Hematological:  Negative for adenopathy. Does not bruise/bleed easily.  Psychiatric/Behavioral:  Negative for dysphoric mood. The patient is not nervous/anxious.     Patient Active Problem List   Diagnosis Date Noted   Mixed hyperlipidemia 12/05/2021   Controlled diabetes mellitus type 2 with complications ([place]) 53/66/4403   S/P vaginal hysterectomy 05/07/2019   Uterine prolapse 03/18/2019   Dysuria 06/12/2018   Vulvar candidiasis 06/12/2018    Allergies  Allergen Reactions   Levaquin [Levofloxacin In D5w] Itching and Other (See Comments)    blisters   Onion Other (See Comments)    Diarrhea and vomiting    Past Surgical History:  Procedure Laterality Date   CERVIX SURGERY  POLYP   COLONOSCOPY  2013   Dr Gustavo Lah- every 5 yrs   CYSTOCELE REPAIR N/A 04/24/2019   Procedure: ANTERIOR REPAIR (CYSTOCELE);  Surgeon: Gae Dry, MD;  Location: [facility] ORS;  Service: Gynecology;  Laterality: N/A;   FOOT  SURGERY Left    lithotripsy     VAGINAL HYSTERECTOMY N/A 04/24/2019   Procedure: HYSTERECTOMY VAGINAL/ BSO;  Surgeon: Gae Dry, MD;  Location: [facility] OR[facility];  Service: Gynecology;  Laterality: N/A;    Social History   Tobacco Use   Smoking status: Never   Smokeless tobacco: Never  Vaping Use   Vaping Use: Never used  Substance Use Topics   Alcohol use: No   Drug use: No     Medication list has been reviewed and  updated.  Current Meds  Medication Sig   ibuprofen (ADVIL) 600 MG tablet Take 1 tablet (600 mg total) by mouth every 6 (six) hours as needed.       03/07/2022    9:04 AM 11/01/2021   10:12 AM 07/06/2020   10:18 AM 06/30/2019    2:15 PM  GAD 7 : Generalized Anxiety Score  Nervous, Anxious, on Edge 2 0 3 1  Control/stop worrying 2 0 3 0  Worry too much - different things 2 0 3 2  Trouble relaxing 0 0 3 0  Restless 0 0 3 0  Easily annoyed or irritable 0 0 3 0  Afraid - awful might happen 1 0 3 0  Total GAD 7 Score 7 0 21 3  Anxiety Difficulty Not difficult at all Not difficult at all Not difficult at all Not difficult at all       03/07/2022    9:03 AM 11/01/2021   10:11 AM 07/06/2020   10:16 AM  Depression screen PHQ 2/9  Decreased Interest 0 0 3  Down, Depressed, Hopeless 1 0 3  PHQ - 2 Score 1 0 6  Altered sleeping 0 0 0  Tired, decreased energy 0 0 0  Change in appetite 0 0 0  Feeling bad or failure about yourself  0 0 1  Trouble concentrating 0 0 1  Moving slowly or fidgety/restless 0 0 3  Suicidal thoughts 0 0 1  PHQ-9 Score 1 0 12  Difficult doing work/chores Not difficult at all Not difficult at all Somewhat difficult    BP Readings from Last 3 Encounters:  03/07/22 110/80  11/01/21 130/80  05/01/21 (!) 143/62    Physical Exam Vitals and nursing note reviewed. Exam conducted with a chaperone present.  Constitutional:      General: She is not in acute distress.    Appearance: She is not diaphoretic.  HENT:     Head: Normocephalic and atraumatic.     Right Ear: Tympanic membrane and external ear normal.     Left Ear: Tympanic membrane and external ear normal.     Nose: Nose normal. No congestion or rhinorrhea.     Mouth/Throat:     Mouth: Mucous membranes are moist.  Eyes:     General:        Right eye: No discharge.        Left eye: No discharge.     Conjunctiva/sclera: Conjunctivae normal.     Pupils: Pupils are equal, round, and reactive to light.   Neck:     Thyroid: No thyromegaly.     Vascular: No JVD.  Cardiovascular:     Rate and Rhythm: Normal rate and regular rhythm.     Heart sounds: Normal heart sounds. No murmur heard.    No friction rub. No gallop.  Pulmonary:     Effort: Pulmonary effort is normal.     Breath sounds: Normal breath sounds. No wheezing or rhonchi.  Abdominal:  General: Bowel sounds are normal.     Palpations: Abdomen is soft. There is no mass.     Tenderness: There is no abdominal tenderness. There is no guarding.  Musculoskeletal:        General: Normal range of motion.     Cervical back: Normal range of motion and neck supple.  Lymphadenopathy:     Cervical: No cervical adenopathy.  Skin:    General: Skin is warm and dry.  Neurological:     Mental Status: She is alert.     Deep Tendon Reflexes: Reflexes are normal and symmetric.     Wt Readings from Last 3 Encounters:  03/07/22 162 lb (73.5 kg)  11/01/21 165 lb (74.8 kg)  05/01/21 162 lb (73.5 kg)    BP 110/80   Pulse 82   Ht '5\' 5"'$  (1.651 m)   Wt 162 lb (73.5 kg)   SpO2 97%   BMI 26.96 kg/m   Assessment and Plan:  1. Mixed hyperlipidemia Chronic.  Uncontrolled.  Stable.  LDL in the 140 range for 3 years.  However patient likes to control this with dietary but we have had a long discussion about how the rules change when you are diabetic and that there is an expectation that a statin will be involved even if it is low normal.  We will recheck lipid panel patient does have Crestor 5 mg to start if still in a elevated region and understands that we may need to start a statin if there is elevation of LDL. - Lipid Panel With LDL/HDL Ratio  2. Dyslipidemia As noted above - Lipid Panel With LDL/HDL Ratio  3. Controlled type 2 diabetes mellitus with complication, without long-term current use of insulin ([place]) New onset.  Persistent.  Stable.  Will check A1c and microalbuminuria and we have also discussed the possibility of an ACE  inhibitor if microalbuminuria is positive.  Reemphasized carbohydrate surveillance as well as elimination of concentrated sweets. - HgB A1c - Microalbumin / creatinine urine ratio    Otilio Miu, MD

## 2022-03-07 NOTE — Patient Instructions (Signed)
.  mmc

## 2022-03-08 LAB — LIPID PANEL WITH LDL/HDL RATIO
Cholesterol, Total: 196 mg/dL (ref 100–199)
HDL: 58 mg/dL (ref >39)
LDL Chol Calc (NIH): 118 mg/dL — ABNORMAL HIGH (ref 0–99)
LDL/HDL Ratio: 2 ratio (ref 0.0–3.2)
Triglycerides: 110 mg/dL (ref 0–149)
VLDL Cholesterol Cal: 20 mg/dL (ref 5–40)

## 2022-03-08 LAB — HEMOGLOBIN A1C
Est. average glucose Bld gHb Est-mCnc: 134 mg/dL
Hgb A1c MFr Bld: 6.3 % — ABNORMAL HIGH (ref 4.8–5.6)

## 2022-03-08 LAB — MICROALBUMIN / CREATININE URINE RATIO
Creatinine, Urine: 267.3 mg/dL
Microalb/Creat Ratio: 8 mg/g creat (ref 0–29)
Microalbumin, Urine: 21.8 ug/mL

## 2022-03-10 DIAGNOSIS — R921 Mammographic calcification found on diagnostic imaging of breast: Secondary | ICD-10-CM | POA: Diagnosis not present

## 2022-03-10 DIAGNOSIS — D0511 Intraductal carcinoma in situ of right breast: Secondary | ICD-10-CM | POA: Diagnosis not present

## 2022-03-10 DIAGNOSIS — R928 Other abnormal and inconclusive findings on diagnostic imaging of breast: Secondary | ICD-10-CM | POA: Diagnosis not present

## 2022-03-14 IMAGING — CR DG ABDOMEN 1V
2 series · 2 of 2 positions shown · non-contrast
Comparison: No priors.

CLINICAL DATA: 66-year-old female with urinary urgency. Burning
with urination. Left-sided flank pain.

EXAM:
ABDOMEN - 1 VIEW

[abdomen kub (1 of 2)]
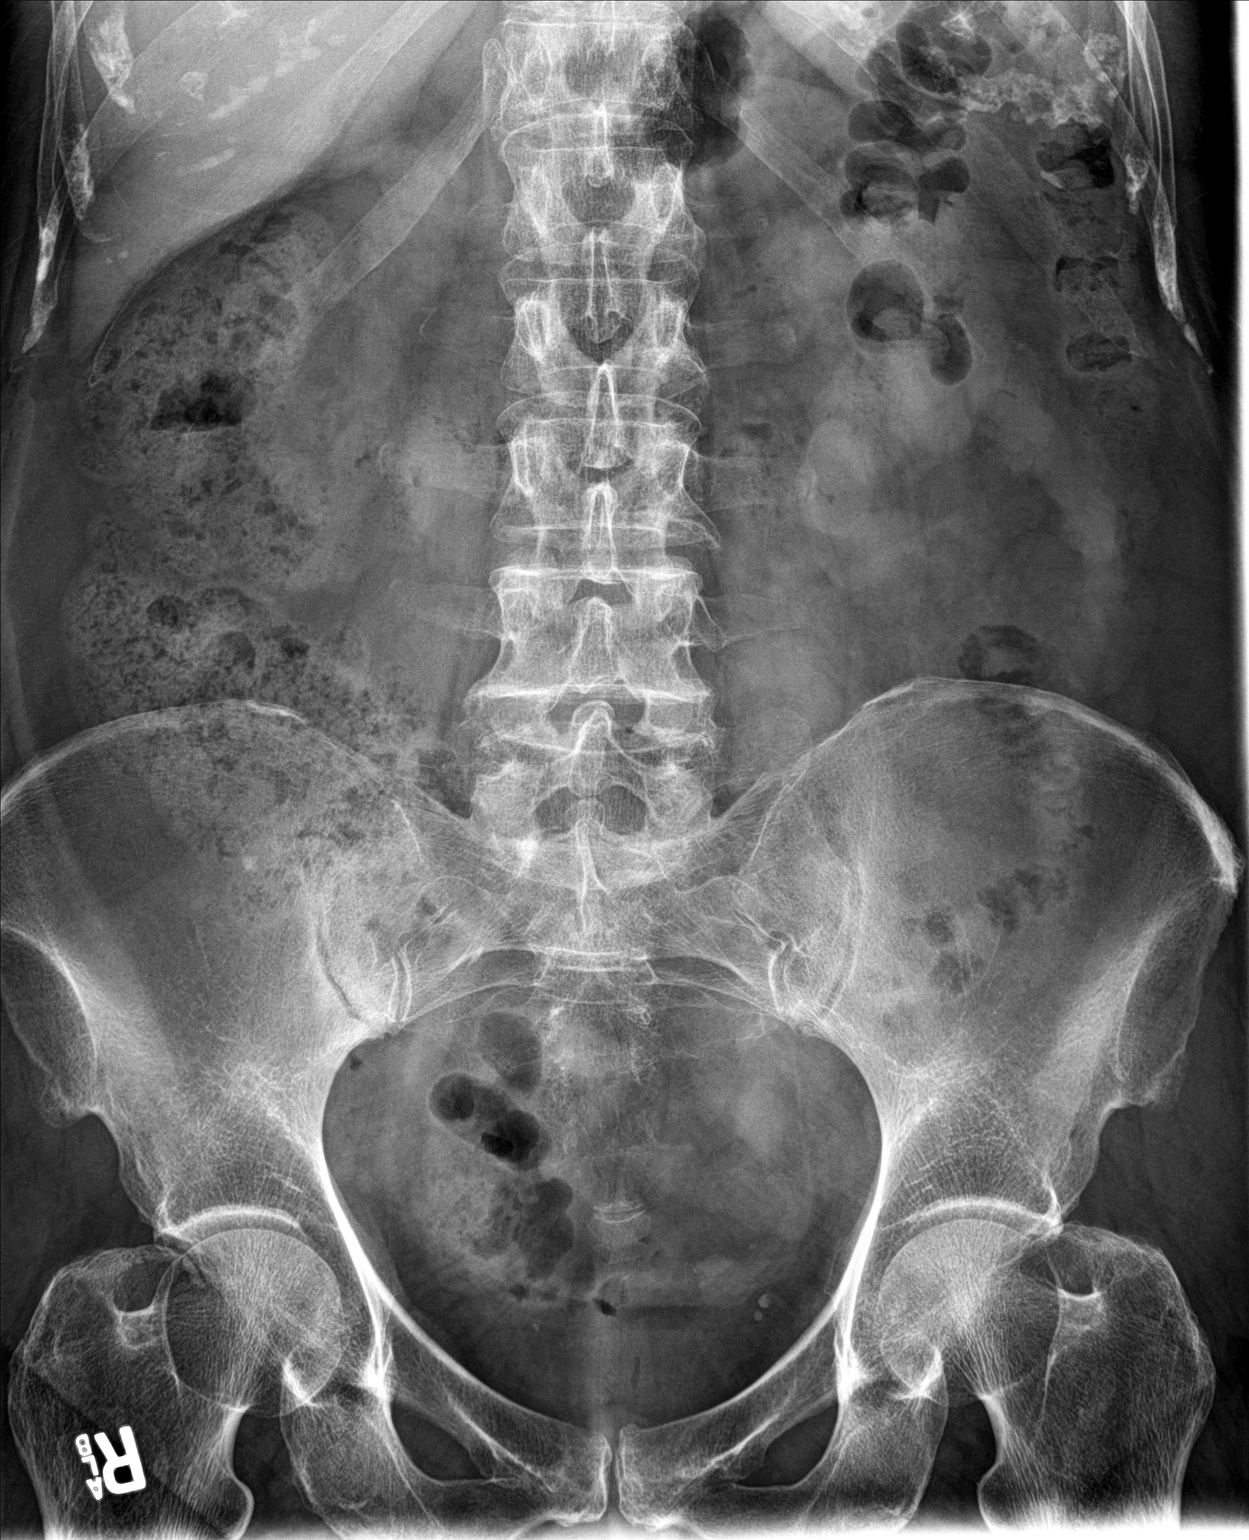

[abdomen kub (2 of 2)]
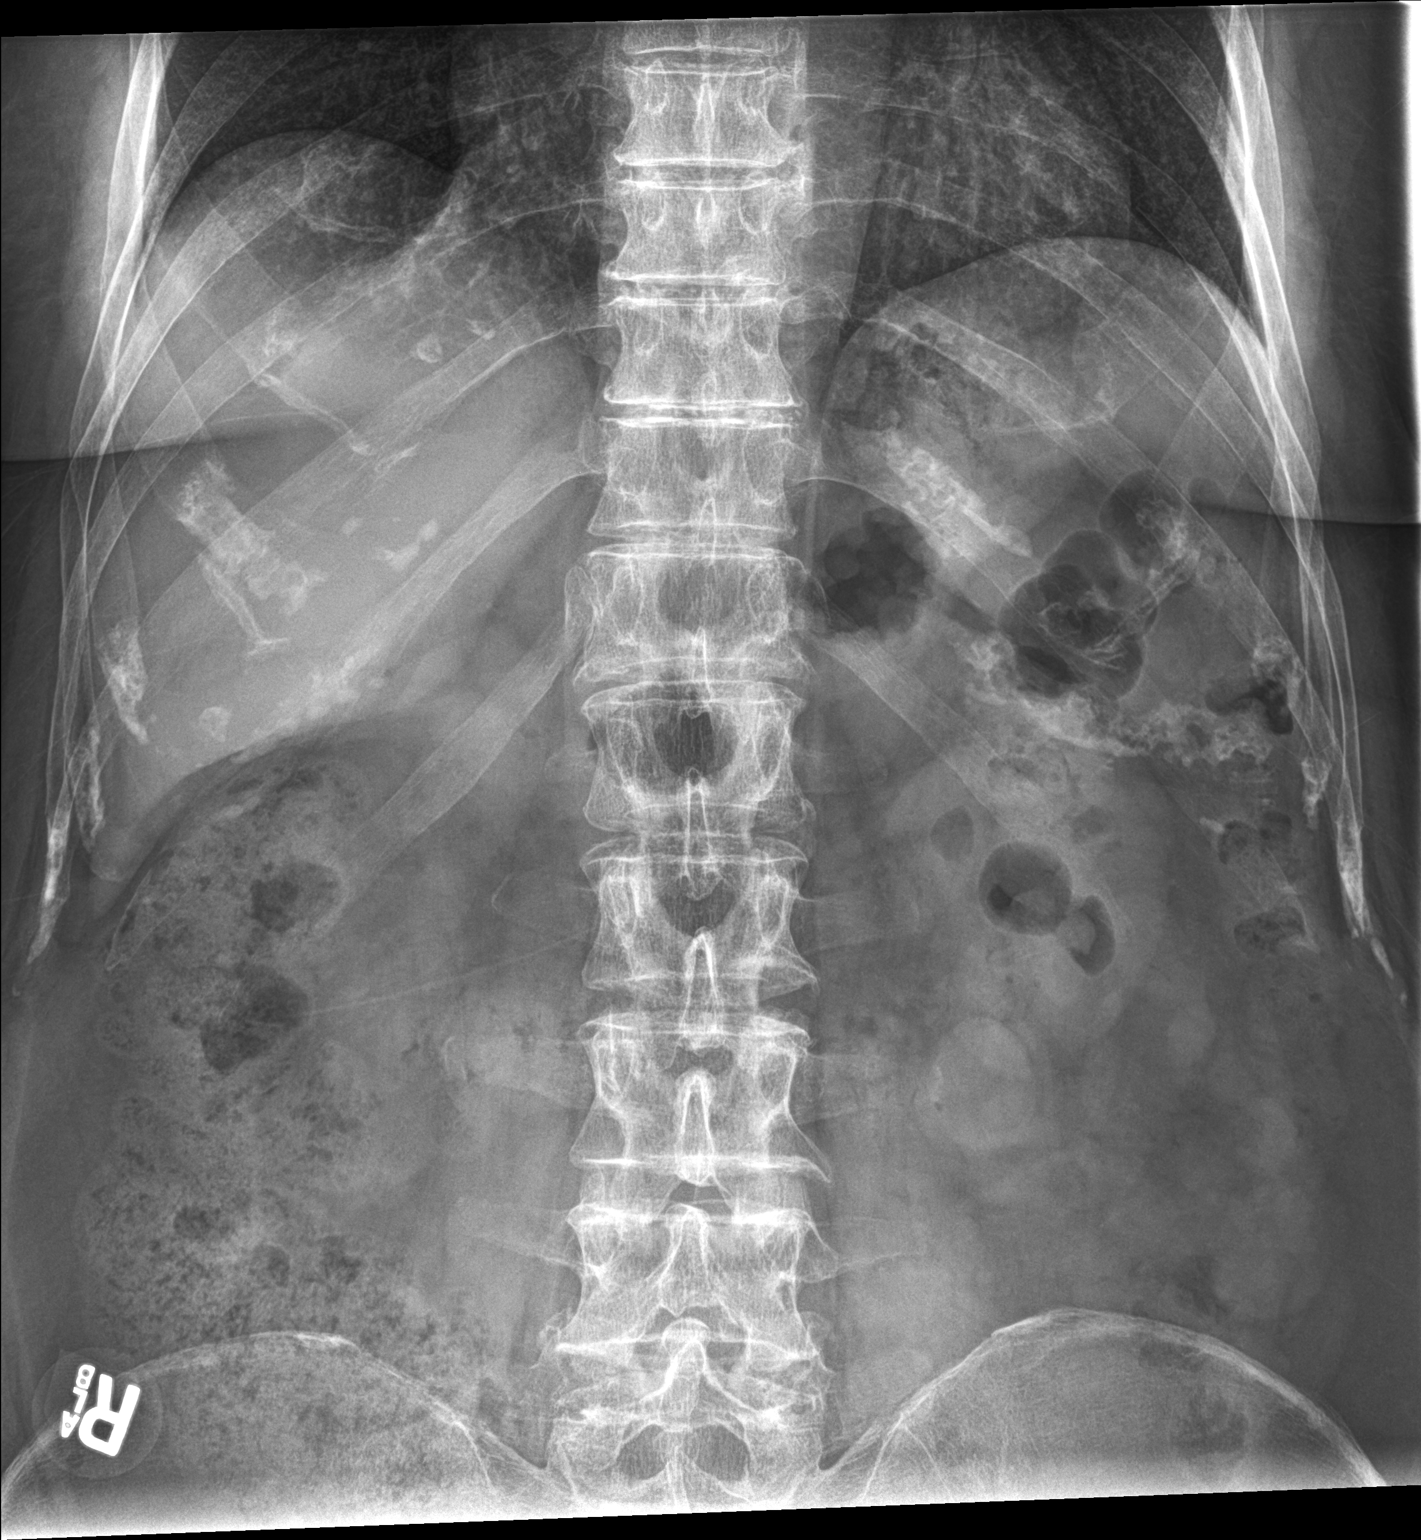

[2 of 2 positions shown; findings below may reference images not displayed]

FINDINGS: The bowel gas pattern is normal. No radio-opaque calculi or other
significant radiographic abnormality are seen.
IMPRESSION: Negative.

## 2022-03-22 DIAGNOSIS — D0512 Intraductal carcinoma in situ of left breast: Secondary | ICD-10-CM | POA: Diagnosis not present

## 2022-03-22 DIAGNOSIS — R928 Other abnormal and inconclusive findings on diagnostic imaging of breast: Secondary | ICD-10-CM | POA: Diagnosis not present

## 2022-03-22 DIAGNOSIS — D0511 Intraductal carcinoma in situ of right breast: Secondary | ICD-10-CM | POA: Diagnosis not present

## 2022-03-22 DIAGNOSIS — Z1231 Encounter for screening mammogram for malignant neoplasm of breast: Secondary | ICD-10-CM | POA: Diagnosis not present

## 2022-03-22 DIAGNOSIS — D0591 Unspecified type of carcinoma in situ of right breast: Secondary | ICD-10-CM | POA: Diagnosis not present

## 2022-03-22 DIAGNOSIS — R921 Mammographic calcification found on diagnostic imaging of breast: Secondary | ICD-10-CM | POA: Diagnosis not present

## 2022-03-27 DIAGNOSIS — R928 Other abnormal and inconclusive findings on diagnostic imaging of breast: Secondary | ICD-10-CM | POA: Diagnosis not present

## 2022-03-27 DIAGNOSIS — D0511 Intraductal carcinoma in situ of right breast: Secondary | ICD-10-CM | POA: Diagnosis not present

## 2022-04-10 DIAGNOSIS — D0511 Intraductal carcinoma in situ of right breast: Secondary | ICD-10-CM | POA: Diagnosis not present

## 2022-04-10 DIAGNOSIS — R921 Mammographic calcification found on diagnostic imaging of breast: Secondary | ICD-10-CM | POA: Diagnosis not present

## 2022-04-11 DIAGNOSIS — R7303 Prediabetes: Secondary | ICD-10-CM | POA: Diagnosis not present

## 2022-04-11 DIAGNOSIS — D0511 Intraductal carcinoma in situ of right breast: Secondary | ICD-10-CM | POA: Diagnosis not present

## 2022-04-11 DIAGNOSIS — E785 Hyperlipidemia, unspecified: Secondary | ICD-10-CM | POA: Diagnosis not present

## 2022-04-11 DIAGNOSIS — Z171 Estrogen receptor negative status [ER-]: Secondary | ICD-10-CM | POA: Diagnosis not present

## 2022-04-11 DIAGNOSIS — M810 Age-related osteoporosis without current pathological fracture: Secondary | ICD-10-CM | POA: Diagnosis not present

## 2022-04-11 HISTORY — PX: BREAST LUMPECTOMY: SHX2

## 2022-04-21 DIAGNOSIS — D0511 Intraductal carcinoma in situ of right breast: Secondary | ICD-10-CM | POA: Diagnosis not present

## 2022-04-21 DIAGNOSIS — D0512 Intraductal carcinoma in situ of left breast: Secondary | ICD-10-CM | POA: Diagnosis not present

## 2022-04-21 DIAGNOSIS — D0591 Unspecified type of carcinoma in situ of right breast: Secondary | ICD-10-CM | POA: Diagnosis not present

## 2022-04-21 DIAGNOSIS — R928 Other abnormal and inconclusive findings on diagnostic imaging of breast: Secondary | ICD-10-CM | POA: Diagnosis not present

## 2022-05-16 DIAGNOSIS — D0511 Intraductal carcinoma in situ of right breast: Secondary | ICD-10-CM | POA: Diagnosis not present

## 2022-05-16 DIAGNOSIS — R928 Other abnormal and inconclusive findings on diagnostic imaging of breast: Secondary | ICD-10-CM | POA: Diagnosis not present

## 2022-05-16 DIAGNOSIS — D0512 Intraductal carcinoma in situ of left breast: Secondary | ICD-10-CM | POA: Diagnosis not present

## 2022-05-16 DIAGNOSIS — D0591 Unspecified type of carcinoma in situ of right breast: Secondary | ICD-10-CM | POA: Diagnosis not present

## 2022-07-06 ENCOUNTER — Ambulatory Visit: Payer: Medicare PPO | Admitting: Family Medicine

## 2022-07-19 DIAGNOSIS — L814 Other melanin hyperpigmentation: Secondary | ICD-10-CM | POA: Diagnosis not present

## 2022-07-19 DIAGNOSIS — D2271 Melanocytic nevi of right lower limb, including hip: Secondary | ICD-10-CM | POA: Diagnosis not present

## 2022-07-19 DIAGNOSIS — Z872 Personal history of diseases of the skin and subcutaneous tissue: Secondary | ICD-10-CM | POA: Diagnosis not present

## 2022-07-19 DIAGNOSIS — Z86018 Personal history of other benign neoplasm: Secondary | ICD-10-CM | POA: Diagnosis not present

## 2022-07-19 DIAGNOSIS — D485 Neoplasm of uncertain behavior of skin: Secondary | ICD-10-CM | POA: Diagnosis not present

## 2022-07-19 DIAGNOSIS — L578 Other skin changes due to chronic exposure to nonionizing radiation: Secondary | ICD-10-CM | POA: Diagnosis not present

## 2022-08-01 ENCOUNTER — Telehealth: Payer: Self-pay

## 2022-08-01 NOTE — Telephone Encounter (Signed)
Called pt left VM to call back. Pt needs to schedule AWV. Please forward the call to the office. Ask to speak to [facility] to schedule.  KP

## 2022-08-09 ENCOUNTER — Ambulatory Visit (INDEPENDENT_AMBULATORY_CARE_PROVIDER_SITE_OTHER): Payer: Medicare PPO

## 2022-08-09 VITALS — Ht 65.0 in | Wt 162.0 lb

## 2022-08-09 DIAGNOSIS — Z0000 Encounter for general adult medical examination without abnormal findings: Secondary | ICD-10-CM | POA: Diagnosis not present

## 2022-08-09 NOTE — Progress Notes (Signed)
Subjective:   Olivia Richards is a 67 y.o. female who presents for Medicare Annual (Subsequent) preventive examination.  Visit Complete: Virtual  I connected with  Olivia Richards on 08/09/22 by a audio enabled telemedicine application and verified that I am speaking with the correct person using two identifiers.  Patient Location: Home  Provider Location: Office/Clinic  I discussed the limitations of evaluation and management by telemedicine. The patient expressed understanding and agreed to proceed.  Patient Medicare AWV questionnaire was completed by the patient on Olivia Richards; I have confirmed that all information answered by patient is correct and no changes since this date.  Review of Systems     Cardiac Risk Factors include: dyslipidemia     Objective:    Today's Vitals   08/09/22 0830  Weight: 162 lb (73.5 kg)  Height: 5\' 5"  (1.651 m)   Body mass index is 26.96 kg/m.     08/09/2022    8:47 AM 05/01/2021    8:18 AM 04/05/2021   12:27 PM 04/21/2019    9:14 AM 02/04/2017    1:43 PM 02/19/2015    2:20 PM  Advanced Directives  Does Patient Have a Medical Advance Directive? No No No No No No  Would patient like information on creating a medical advance directive? No - Patient declined   No - Patient declined No - Patient declined No - patient declined information    Current Medications (verified) Outpatient Encounter Medications as of 08/09/2022  Medication Sig   ibuprofen (ADVIL) 600 MG tablet Take 1 tablet (600 mg total) by mouth every 6 (six) hours as needed.   rosuvastatin (CRESTOR) 5 MG tablet Take 1 tablet (5 mg total) by mouth daily. (Patient not taking: Reported on 03/07/2022)   No facility-administered encounter medications on file as of 08/09/2022.    Allergies (verified) Levaquin [levofloxacin in d5w] and Onion   History: Past Medical History:  Diagnosis Date   DDD (degenerative disc disease), cervical    Family history of breast cancer     H/O colonoscopy 2017   History of kidney stones    Low blood pressure    Past Surgical History:  Procedure Laterality Date   BREAST LUMPECTOMY Right 04/11/2022   CERVIX SURGERY  POLYP   COLONOSCOPY  02/21/2011   Dr Olivia Richards- every 5 yrs   CYSTOCELE REPAIR N/A 04/24/2019   Procedure: ANTERIOR REPAIR (CYSTOCELE);  Surgeon: Olivia Mustard, MD;  Location: [facility] ORS;  Service: Gynecology;  Laterality: N/A;   FOOT SURGERY Left    lithotripsy     VAGINAL HYSTERECTOMY N/A 04/24/2019   Procedure: HYSTERECTOMY VAGINAL/ BSO;  Surgeon: Olivia Mustard, MD;  Location: [facility] ORS;  Service: Gynecology;  Laterality: N/A;   Family History  Problem Relation Age of Onset   Breast cancer Mother 54   COPD Mother    Hyperlipidemia Mother    Heart disease Father    Social History   Socioeconomic History   Marital status: Divorced    Spouse name: Not on file   Number of children: Not on file   Years of education: Not on file   Highest education level: Not on file  Occupational History   Not on file  Tobacco Use   Smoking status: Never   Smokeless tobacco: Never  Vaping Use   Vaping Use: Never used  Substance and Sexual Activity   Alcohol use: No   Drug use: No   Sexual activity: Not Currently    Birth control/protection: Post-menopausal  Other Topics Concern   Not on file  Social History Narrative   Not on file   Social Determinants of Health   Financial Resource Strain: Low Risk  (08/09/2022)   Overall Financial Resource Strain (CARDIA)    Difficulty of Paying Living Expenses: Not hard at all  Food Insecurity: No Food Insecurity (08/09/2022)   Hunger Vital Sign    Worried About Running Out of Food in the Last Year: Never true    Ran Out of Food in the Last Year: Never true  Transportation Needs: No Transportation Needs (08/09/2022)   PRAPARE - Administrator, Civil Service (Medical): No    Lack of Transportation (Non-Medical): No  Physical Activity: Sufficiently Active  (08/09/2022)   Exercise Vital Sign    Days of Exercise per Week: 7 days    Minutes of Exercise per Session: 30 min  Stress: No Stress Concern Present (08/09/2022)   Olivia Richards of Occupational [employer] - Occupational Stress Questionnaire    Feeling of Stress : Not at all  Social Connections: Socially Isolated (08/09/2022)   Social Connection and Isolation Panel [NHANES]    Frequency of Communication with Friends and Family: More than three times a week    Frequency of Social Gatherings with Friends and Family: More than three times a week    Attends Religious Services: Never    Database administrator or Organizations: No    Attends Engineer, structural: Never    Marital Status: Divorced    Tobacco Counseling Counseling given: Yes   Clinical Intake:  Pre-visit preparation completed: No  Pain : No/denies pain     BMI - recorded: 26.96 Nutritional Risks: None Diabetes: No  How often do you need to have someone help you when you read instructions, pamphlets, or other written materials from your doctor or pharmacy?: 1 - Never What is the last grade level you completed in school?: masters  Interpreter Needed?: No  Information entered by :: Olivia Richards   Activities of Daily Living    08/09/2022    8:37 AM  In your present state of health, do you have any difficulty performing the following activities:  Hearing? 0  Vision? 0  Difficulty concentrating or making decisions? 0  Walking or climbing stairs? 0  Dressing or bathing? 0  Doing errands, shopping? 0  Preparing Food and eating ? N  Using the Toilet? N  In the past six months, have you accidently leaked urine? N  Do you have problems with loss of bowel control? N  Managing your Medications? N  Managing your Finances? N  Housekeeping or managing your Housekeeping? N    Patient Care Team: Olivia Limerick, MD as PCP - General (Family Medicine) Olivia Limerick, MD (Family Medicine)  Indicate any recent  Medical Services you may have received from other than [facility] providers in the past year (date may be approximate).     Assessment:   This is a routine wellness examination for Olivia Richards.  Hearing/Vision screen Hearing Screening - Comments:: Unable to obtain  Dietary issues and exercise activities discussed:     Goals Addressed             This Visit's Progress    Cut out extra servings       Lower cholesterol and [place]       Depression Screen    08/09/2022    8:49 AM 03/07/2022    9:03 AM 11/01/2021   10:11  AM 07/06/2020   10:16 AM 06/30/2019    2:15 PM 06/10/2015    8:13 AM 02/19/2015    2:21 PM  PHQ 2/9 Scores  PHQ - 2 Score 0 1 0 6 0 0 1  PHQ- 9 Score  1 0 12 0      Fall Risk    08/09/2022    8:48 AM 03/07/2022    9:03 AM 11/01/2021   10:11 AM 06/30/2019    2:15 PM 06/10/2015    8:13 AM  Fall Risk   Falls in the past year?  0 0 0 No  Number falls in past yr:  0 0    Injury with Fall?  0 0    Risk for fall due to : No Fall Risks No Fall Risks No Fall Risks    Follow up Falls evaluation completed Falls evaluation completed Falls evaluation completed Falls evaluation completed     MEDICARE RISK AT HOME:  Medicare Risk at Home - 08/09/22 0848     Any stairs in or around the home? Yes    If so, are there any without handrails? Yes    Home free of loose throw rugs in walkways, pet beds, electrical cords, etc? No    Adequate lighting in your home to reduce risk of falls? Yes    Life alert? No    Use of a cane, walker or w/c? No    Grab bars in the bathroom? No    Shower chair or bench in shower? No    Elevated toilet seat or a handicapped toilet? No             TIMED UP AND GO:  Was the test performed?  No    Cognitive Function:        08/09/2022    8:49 AM  6CIT Screen  What Year? 0 points  What month? 0 points  What time? 0 points  Count back from 20 0 points  Months in reverse 0 points  Repeat phrase 0 points  Total Score 0 points     Immunizations Immunization History  Administered Date(s) Administered   Influenza,inj,Quad PF,6+ Mos 12/18/2018   Influenza-Unspecified 11/26/2017   Moderna Sars-Covid-2 Vaccination 04/06/2019, 05/04/2019, 01/19/2020   Tdap 02/20/2013    TDAP status: Up to date  Flu Vaccine status: Up to date  Pneumococcal vaccine status: Declined,  Education has been provided regarding the importance of this vaccine but patient still declined. Advised may receive this vaccine at local pharmacy or Health Dept. Aware to provide a copy of the vaccination record if obtained from local pharmacy or Health Dept. Verbalized acceptance and understanding.   Covid-19 vaccine status: Declined, Education has been provided regarding the importance of this vaccine but patient still declined. Advised may receive this vaccine at local pharmacy or Health Dept.or vaccine clinic. Aware to provide a copy of the vaccination record if obtained from local pharmacy or Health Dept. Verbalized acceptance and understanding.  Qualifies for Shingles Vaccine? No   Zostavax completed No   Shingrix Completed?: No.    Education has been provided regarding the importance of this vaccine. Patient has been advised to call insurance company to determine out of pocket expense if they have not yet received this vaccine. Advised may also receive vaccine at local pharmacy or Health Dept. Verbalized acceptance and understanding.  Screening Tests Health Maintenance  Topic Date Due   OPHTHALMOLOGY EXAM  03/23/2021   COVID-19 Vaccine (4 - 2023-24 season) 08/25/2022 (Originally  10/21/2021)   Pneumonia Vaccine 50+ Years old (1 of 1 - PCV) 11/02/2022 (Originally 03/12/2019)   Zoster Vaccines- Shingrix (1 of 2) 11/09/2022 (Originally 03/11/1974)   HEMOGLOBIN A1C  09/05/2022   INFLUENZA VACCINE  09/21/2022   Diabetic kidney evaluation - eGFR measurement  11/02/2022   DTaP/Tdap/Td (2 - Td or Tdap) 02/21/2023   Diabetic kidney evaluation - Urine ACR   03/08/2023   FOOT EXAM  03/08/2023   Medicare Annual Wellness (AWV)  08/09/2023   MAMMOGRAM  03/10/2024   Colonoscopy  06/09/2025   DEXA SCAN  Completed   Hepatitis C Screening  Completed   HPV VACCINES  Aged Out    Health Maintenance  Health Maintenance Due  Topic Date Due   OPHTHALMOLOGY EXAM  03/23/2021    Colorectal cancer screening: Type of screening: Colonoscopy. Completed 06/10/2015. Repeat every 10 years  Mammogram status: Completed 03/10/2022. Repeat every year  Bone Density status: Completed 10/18/20. Results reflect: Bone density results: NORMAL. Repeat every 2 years.  Lung Cancer Screening: (Low Dose CT Chest recommended if Age 58-80 years, 20 pack-year currently smoking OR have quit w/in 15years.) does not qualify.   Lung Cancer Screening Referral: n/a  Additional Screening:  Hepatitis C Screening: does not want  Vision Screening: Recommended annual ophthalmology exams for early detection of glaucoma and other disorders of the eye. Is the patient up to date with their annual eye exam?   pt stated she had it done in 2022 and does not wish to have another just yet. Who is the provider or what is the name of the office in which the patient attends annual eye exams? [facility] If pt is not established with a provider, would they like to be referred to a provider to establish care? No .   Dental Screening: Recommended annual dental exams for proper oral hygiene     Plan:     I have personally reviewed and noted the following in the patient's chart:   Medical and social history Use of alcohol, tobacco or illicit drugs  Current medications and supplements including opioid prescriptions.  Functional ability and status Nutritional status Physical activity Advanced directives List of other physicians Hospitalizations, surgeries, and ER visits in previous 12 months Vitals Screenings to include cognitive, depression, and falls Referrals and appointments  In  addition, I have reviewed and discussed with patient certain preventive protocols, quality metrics, and best practice recommendations. A written personalized care plan for preventive services as well as general preventive health recommendations were provided to patient.     Everitt Amber   08/09/2022   After Visit Summary: (MyChart) Due to this being a telephonic visit, the after visit summary with patients personalized plan was offered to patient via MyChart   Nurse Notes: Patient does not have concerns at this time

## 2022-08-10 NOTE — Addendum Note (Signed)
Addended by: Everitt Amber on: 08/10/2022 08:48 AM   Modules accepted: Level of Service

## 2022-08-10 NOTE — Progress Notes (Signed)
Subjective:   Olivia Richards is a 67 y.o. female who presents for Medicare Annual (Subsequent) preventive examination.  Visit Complete: Virtual  I connected with  Olivia Richards on 08/10/22 by a audio enabled telemedicine application and verified that I am speaking with the correct person using two identifiers.  Patient Location: Home  Provider Location: Office/Clinic  I discussed the limitations of evaluation and management by telemedicine. The patient expressed understanding and agreed to proceed.    Review of Systems     Cardiac Risk Factors include: dyslipidemia;advanced age (>67men, >29 women)     Objective:    Today's Vitals   08/09/22 0830  Weight: 162 lb (73.5 kg)  Height: 5\' 5"  (1.651 m)   Body mass index is 26.96 kg/m.     08/09/2022    8:47 AM 05/01/2021    8:18 AM 04/05/2021   12:27 PM 04/21/2019    9:14 AM 02/04/2017    1:43 PM 02/19/2015    2:20 PM  Advanced Directives  Does Patient Have a Medical Advance Directive? No No No No No No  Would patient like information on creating a medical advance directive? No - Patient declined   No - Patient declined No - Patient declined No - patient declined information    Current Medications (verified) Outpatient Encounter Medications as of 08/09/2022  Medication Sig   ibuprofen (ADVIL) 600 MG tablet Take 1 tablet (600 mg total) by mouth every 6 (six) hours as needed.   rosuvastatin (CRESTOR) 5 MG tablet Take 1 tablet (5 mg total) by mouth daily. (Patient not taking: Reported on 03/07/2022)   No facility-administered encounter medications on file as of 08/09/2022.    Allergies (verified) Levaquin [levofloxacin in d5w] and Onion   History: Past Medical History:  Diagnosis Date   DDD (degenerative disc disease), cervical    Family history of breast cancer    H/O colonoscopy 2017   History of kidney stones    Low blood pressure    Past Surgical History:  Procedure Laterality Date   BREAST LUMPECTOMY  Right 04/11/2022   CERVIX SURGERY  POLYP   COLONOSCOPY  02/21/2011   Dr Marva Panda- every 5 yrs   CYSTOCELE REPAIR N/A 04/24/2019   Procedure: ANTERIOR REPAIR (CYSTOCELE);  Surgeon: Nadara Mustard, MD;  Location: [facility];  Service: Gynecology;  Laterality: N/A;   FOOT SURGERY Left    lithotripsy     VAGINAL HYSTERECTOMY N/A 04/24/2019   Procedure: HYSTERECTOMY VAGINAL/ BSO;  Surgeon: Nadara Mustard, MD;  Location: [facility];  Service: Gynecology;  Laterality: N/A;   Family History  Problem Relation Age of Onset   Breast cancer Mother 40   COPD Mother    Hyperlipidemia Mother    Heart disease Father    Social History   Socioeconomic History   Marital status: Divorced    Spouse name: Not on file   Number of children: Not on file   Years of education: Not on file   Highest education level: Not on file  Occupational History   Not on file  Tobacco Use   Smoking status: Never   Smokeless tobacco: Never  Vaping Use   Vaping Use: Never used  Substance and Sexual Activity   Alcohol use: No   Drug use: No   Sexual activity: Not Currently    Birth control/protection: Post-menopausal  Other Topics Concern   Not on file  Social History Narrative   Not on file   Social Determinants of Health  Financial Resource Strain: Low Risk  (08/09/2022)   Overall Financial Resource Strain (CARDIA)    Difficulty of Paying Living Expenses: Not hard at all  Food Insecurity: No Food Insecurity (08/09/2022)   Hunger Vital Sign    Worried About Running Out of Food in the Last Year: Never true    Ran Out of Food in the Last Year: Never true  Transportation Needs: No Transportation Needs (08/09/2022)   PRAPARE - Administrator, Civil Service (Medical): No    Lack of Transportation (Non-Medical): No  Physical Activity: Sufficiently Active (08/09/2022)   Exercise Vital Sign    Days of Exercise per Week: 7 days    Minutes of Exercise per Session: 30 min  Stress: No Stress Concern  Present (08/09/2022)   [employer] of Occupational Health - Occupational Stress Questionnaire    Feeling of Stress : Not at all  Social Connections: Socially Isolated (08/09/2022)   Social Connection and Isolation Panel [NHANES]    Frequency of Communication with Friends and Family: More than three times a week    Frequency of Social Gatherings with Friends and Family: More than three times a week    Attends Religious Services: Never    Database administrator or Organizations: No    Attends Engineer, structural: Never    Marital Status: Divorced    Tobacco Counseling Counseling given: Yes   Clinical Intake:  Pre-visit preparation completed: No  Pain : No/denies pain     BMI - recorded: 26.96 Nutritional Status: BMI 25 -29 Overweight Nutritional Risks: None Diabetes: No  How often do you need to have someone help you when you read instructions, pamphlets, or other written materials from your doctor or pharmacy?: 1 - Never What is the last grade level you completed in school?: masters  Interpreter Needed?: No  Information entered by :: [name]   Activities of Daily Living    08/09/2022    8:37 AM  In your present state of health, do you have any difficulty performing the following activities:  Hearing? 0  Vision? 0  Difficulty concentrating or making decisions? 0  Walking or climbing stairs? 0  Dressing or bathing? 0  Doing errands, shopping? 0  Preparing Food and eating ? N  Using the Toilet? N  In the past six months, have you accidently leaked urine? N  Do you have problems with loss of bowel control? N  Managing your Medications? N  Managing your Finances? N  Housekeeping or managing your Housekeeping? N    Patient Care Team: Duanne Limerick, MD as PCP - General (Family Medicine) Duanne Limerick, MD (Family Medicine)  Indicate any recent Medical Services you may have received from other than [facility] providers in the past year (date may be  approximate).     Assessment:   This is a routine wellness examination for Olivia Richards.  Hearing/Vision screen Hearing Screening - Comments:: Denies hearing difficulties  Dietary issues and exercise activities discussed:     Goals Addressed             This Visit's Progress    Cut out extra servings       Lower cholesterol and move to mountains      Depression Screen    08/09/2022    8:49 AM 03/07/2022    9:03 AM 11/01/2021   10:11 AM 07/06/2020   10:16 AM 06/30/2019    2:15 PM 06/10/2015    8:13 AM 02/19/2015  2:21 PM  PHQ 2/9 Scores  PHQ - 2 Score 0 1 0 6 0 0 1  PHQ- 9 Score  1 0 12 0      Fall Risk    08/09/2022    8:48 AM 03/07/2022    9:03 AM 11/01/2021   10:11 AM 06/30/2019    2:15 PM 06/10/2015    8:13 AM  Fall Risk   Falls in the past year? 0 0 0 0 No  Number falls in past yr: 0 0 0    Injury with Fall? 0 0 0    Risk for fall due to : No Fall Risks No Fall Risks No Fall Risks    Follow up Falls evaluation completed Falls evaluation completed Falls evaluation completed Falls evaluation completed     MEDICARE RISK AT HOME:  Medicare Risk at Home - 08/09/22 0848     Any stairs in or around the home? Yes    If so, are there any without handrails? Yes    Home free of loose throw rugs in walkways, pet beds, electrical cords, etc? No    Adequate lighting in your home to reduce risk of falls? Yes    Life alert? No    Use of a cane, walker or w/c? No    Grab bars in the bathroom? No    Shower chair or bench in shower? No    Elevated toilet seat or a handicapped toilet? No             TIMED UP AND GO:  Was the test performed?  No    Cognitive Function:        08/09/2022    8:49 AM  6CIT Screen  What Year? 0 points  What month? 0 points  What time? 0 points  Count back from 20 0 points  Months in reverse 0 points  Repeat phrase 0 points  Total Score 0 points    Immunizations Immunization History  Administered Date(s) Administered    Influenza,inj,Quad PF,6+ Mos 12/18/2018   Influenza-Unspecified 11/26/2017   Moderna Sars-Covid-2 Vaccination 04/06/2019, 05/04/2019, 01/19/2020   Tdap 02/20/2013    TDAP status: Up to date  Flu Vaccine status: Up to date  Pneumococcal vaccine status: Declined,  Education has been provided regarding the importance of this vaccine but patient still declined. Advised may receive this vaccine at local pharmacy or Health Dept. Aware to provide a copy of the vaccination record if obtained from local pharmacy or Health Dept. Verbalized acceptance and understanding.   Covid-19 vaccine status: Declined, Education has been provided regarding the importance of this vaccine but patient still declined. Advised may receive this vaccine at local pharmacy or Health Dept.or vaccine clinic. Aware to provide a copy of the vaccination record if obtained from local pharmacy or Health Dept. Verbalized acceptance and understanding.  Qualifies for Shingles Vaccine? No   Zostavax completed No   Shingrix Completed?: No.    Education has been provided regarding the importance of this vaccine. Patient has been advised to call insurance company to determine out of pocket expense if they have not yet received this vaccine. Advised may also receive vaccine at local pharmacy or Health Dept. Verbalized acceptance and understanding.  Screening Tests Health Maintenance  Topic Date Due   OPHTHALMOLOGY EXAM  03/23/2021   COVID-19 Vaccine (4 - 2023-24 season) 08/25/2022 (Originally 10/21/2021)   Pneumonia Vaccine 67+ Years old (1 of 1 - PCV) 11/02/2022 (Originally 03/12/2019)   Zoster Vaccines- Shingrix (1  of 2) 11/09/2022 (Originally 03/11/1974)   HEMOGLOBIN A1C  09/05/2022   INFLUENZA VACCINE  09/21/2022   Diabetic kidney evaluation - eGFR measurement  11/02/2022   DTaP/Tdap/Td (2 - Td or Tdap) 02/21/2023   Diabetic kidney evaluation - Urine ACR  03/08/2023   FOOT EXAM  03/08/2023   Medicare Annual Wellness (AWV)   08/09/2023   MAMMOGRAM  03/10/2024   Colonoscopy  06/09/2025   DEXA SCAN  Completed   Hepatitis C Screening  Completed   HPV VACCINES  Aged Out    Health Maintenance  Health Maintenance Due  Topic Date Due   OPHTHALMOLOGY EXAM  03/23/2021    Colorectal cancer screening: Type of screening: Colonoscopy. Completed 06/10/2015. Repeat every 10 years  Mammogram status: Completed 03/10/2022. Repeat every year  Bone Density status: Completed 10/18/20. Results reflect: Bone density results: NORMAL. Repeat every 2 years.  Lung Cancer Screening: (Low Dose CT Chest recommended if Age 37-80 years, 20 pack-year currently smoking OR have quit w/in 15years.) does not qualify.   Lung Cancer Screening Referral: n/a  Additional Screening:  Hepatitis C Screening: does not want  Vision Screening: Recommended annual ophthalmology exams for early detection of glaucoma and other disorders of the eye. Is the patient up to date with their annual eye exam?   pt stated she had it done in 2022 and does not wish to have another just yet. Who is the provider or what is the name of the office in which the patient attends annual eye exams? [facility] If pt is not established with a provider, would they like to be referred to a provider to establish care? No .   Dental Screening: Recommended annual dental exams for proper oral hygiene     Plan:     I have personally reviewed and noted the following in the patient's chart:   Medical and social history Use of alcohol, tobacco or illicit drugs  Current medications and supplements including opioid prescriptions.  Functional ability and status Nutritional status Physical activity Advanced directives List of other physicians Hospitalizations, surgeries, and ER visits in previous 12 months Vitals Screenings to include cognitive, depression, and falls Referrals and appointments  In addition, I have reviewed and discussed with patient certain preventive  protocols, quality metrics, and best practice recommendations. A written personalized care plan for preventive services as well as general preventive health recommendations were provided to patient.     Everitt Amber   08/10/2022   After Visit Summary: (MyChart) Due to this being a telephonic visit, the after visit summary with patients personalized plan was offered to patient via MyChart   Nurse Notes: Patient does not have concerns at this time

## 2022-08-10 NOTE — Patient Instructions (Signed)
Olivia Richards , Thank you for taking time to come for your Medicare Wellness Visit. I appreciate your ongoing commitment to your health goals. Please review the following plan we discussed and let me know if I can assist you in the future.   These are the goals we discussed:  Goals      Cut out extra servings     Lower cholesterol and [place] mountains        This is a list of the screening recommended for you and due dates:  Health Maintenance  Topic Date Due   Eye exam for diabetics  03/23/2021   COVID-19 Vaccine (4 - 2023-24 season) 08/25/2022*   Pneumonia Vaccine (1 of 1 - PCV) 11/02/2022*   Zoster (Shingles) Vaccine (1 of 2) 11/09/2022*   Hemoglobin A1C  09/05/2022   Flu Shot  09/21/2022   Yearly kidney function blood test for diabetes  11/02/2022   DTaP/Tdap/Td vaccine (2 - Td or Tdap) 02/21/2023   Yearly kidney health urinalysis for diabetes  03/08/2023   Complete foot exam   03/08/2023   Medicare Annual Wellness Visit  08/09/2023   Mammogram  03/10/2024   Colon Cancer Screening  06/09/2025   DEXA scan (bone density measurement)  Completed   Hepatitis C Screening  Completed   HPV Vaccine  Aged Out  *Topic was postponed. The date shown is not the original due date.   Health Maintenance After Age 68 After age 80, you are at a higher risk for certain long-term diseases and infections as well as injuries from falls. Falls are a major cause of broken bones and head injuries in people who are older than age 71. Getting regular preventive care can help to keep you healthy and well. Preventive care includes getting regular testing and making lifestyle changes as recommended by your health care provider. Talk with your health care provider about: Which screenings and tests you should have. A screening is a test that checks for a disease when you have no symptoms. A diet and exercise plan that is right for you. What should I know about screenings and tests to prevent  falls? Screening and testing are the best ways to find a health problem early. Early diagnosis and treatment give you the best chance of managing medical conditions that are common after age 63. Certain conditions and lifestyle choices may make you more likely to have a fall. Your health care provider may recommend: Regular vision checks. Poor vision and conditions such as cataracts can make you more likely to have a fall. If you wear glasses, make sure to get your prescription updated if your vision changes. Medicine review. Work with your health care provider to regularly review all of the medicines you are taking, including over-the-counter medicines. Ask your health care provider about any side effects that may make you more likely to have a fall. Tell your health care provider if any medicines that you take make you feel dizzy or sleepy. Strength and balance checks. Your health care provider may recommend certain tests to check your strength and balance while standing, walking, or changing positions. Foot health exam. Foot pain and numbness, as well as not wearing proper footwear, can make you more likely to have a fall. Screenings, including: Osteoporosis screening. Osteoporosis is a condition that causes the bones to get weaker and break more easily. Blood pressure screening. Blood pressure changes and medicines to control blood pressure can make you feel dizzy. Depression screening. You may be  more likely to have a fall if you have a fear of falling, feel depressed, or feel unable to do activities that you used to do. Alcohol use screening. Using too much alcohol can affect your balance and may make you more likely to have a fall. Follow these instructions at home: Lifestyle Do not drink alcohol if: Your health care provider tells you not to drink. If you drink alcohol: Limit how much you have to: 0-1 drink a day for women. 0-2 drinks a day for men. Know how much alcohol is in your drink.  In the [place] one drink equals one 12 oz bottle of beer (355 mL), one 5 oz glass of wine (148 mL), or one 1 oz glass of hard liquor (44 mL). Do not use any products that contain nicotine or tobacco. These products include cigarettes, chewing tobacco, and vaping devices, such as e-cigarettes. If you need help quitting, ask your health care provider. Activity  Follow a regular exercise program to stay fit. This will help you maintain your balance. Ask your health care provider what types of exercise are appropriate for you. If you need a cane or walker, use it as recommended by your health care provider. Wear supportive shoes that have nonskid soles. Safety  Remove any tripping hazards, such as rugs, cords, and clutter. Install safety equipment such as grab bars in bathrooms and safety rails on stairs. Keep rooms and walkways well-lit. General instructions Talk with your health care provider about your risks for falling. Tell your health care provider if: You fall. Be sure to tell your health care provider about all falls, even ones that seem minor. You feel dizzy, tiredness (fatigue), or off-balance. Take over-the-counter and prescription medicines only as told by your health care provider. These include supplements. Eat a healthy diet and maintain a healthy weight. A healthy diet includes low-fat dairy products, low-fat (lean) meats, and fiber from whole grains, beans, and lots of fruits and vegetables. Stay current with your vaccines. Schedule regular health, dental, and eye exams. Summary Having a healthy lifestyle and getting preventive care can help to protect your health and wellness after age 22. Screening and testing are the best way to find a health problem early and help you avoid having a fall. Early diagnosis and treatment give you the best chance for managing medical conditions that are more common for people who are older than age 21. Falls are a major cause of broken bones and  head injuries in people who are older than age 59. Take precautions to prevent a fall at home. Work with your health care provider to learn what changes you can make to improve your health and wellness and to prevent falls. This information is not intended to replace advice given to you by your health care provider. Make sure you discuss any questions you have with your health care provider. Document Revised: 06/28/2020 Document Reviewed: 06/28/2020 Elsevier Patient Education  2024 [employer].

## 2022-08-10 NOTE — Progress Notes (Signed)
Subjective:   Olivia Richards is a 67 y.o. female who presents for Medicare Annual (Subsequent) preventive examination.  Visit Complete: Virtual  I connected with  Olivia Richards on 08/10/22 by a audio enabled telemedicine application and verified that I am speaking with the correct person using two identifiers.  Patient Location: Home  Provider Location: Office/Clinic  I discussed the limitations of evaluation and management by telemedicine. The patient expressed understanding and agreed to proceed.  Patient Medicare AWV questionnaire was completed by the patient on Olivia Richards; I have confirmed that all information answered by patient is correct and no changes since this date.  Review of Systems     Cardiac Risk Factors include: dyslipidemia     Objective:    Today's Vitals   08/09/22 0830  Weight: 162 lb (73.5 kg)  Height: 5\' 5"  (1.651 m)   Body mass index is 26.96 kg/m.     08/09/2022    8:47 AM 05/01/2021    8:18 AM 04/05/2021   12:27 PM 04/21/2019    9:14 AM 02/04/2017    1:43 PM 02/19/2015    2:20 PM  Advanced Directives  Does Patient Have a Medical Advance Directive? No No No No No No  Would patient like information on creating a medical advance directive? No - Patient declined   No - Patient declined No - Patient declined No - patient declined information    Current Medications (verified) Outpatient Encounter Medications as of 08/09/2022  Medication Sig   ibuprofen (ADVIL) 600 MG tablet Take 1 tablet (600 mg total) by mouth every 6 (six) hours as needed.   rosuvastatin (CRESTOR) 5 MG tablet Take 1 tablet (5 mg total) by mouth daily. (Patient not taking: Reported on 03/07/2022)   No facility-administered encounter medications on file as of 08/09/2022.    Allergies (verified) Levaquin [levofloxacin in d5w] and Onion   History: Past Medical History:  Diagnosis Date   DDD (degenerative disc disease), cervical    Family history of breast cancer     H/O colonoscopy 2017   History of kidney stones    Low blood pressure    Past Surgical History:  Procedure Laterality Date   BREAST LUMPECTOMY Right 04/11/2022   CERVIX SURGERY  POLYP   COLONOSCOPY  02/21/2011   Dr Marva Panda- every 5 yrs   CYSTOCELE REPAIR N/A 04/24/2019   Procedure: ANTERIOR REPAIR (CYSTOCELE);  Surgeon: Nadara Mustard, MD;  Location: [facility];  Service: Gynecology;  Laterality: N/A;   FOOT SURGERY Left    lithotripsy     VAGINAL HYSTERECTOMY N/A 04/24/2019   Procedure: HYSTERECTOMY VAGINAL/ BSO;  Surgeon: Nadara Mustard, MD;  Location: [facility];  Service: Gynecology;  Laterality: N/A;   Family History  Problem Relation Age of Onset   Breast cancer Mother 71   COPD Mother    Hyperlipidemia Mother    Heart disease Father    Social History   Socioeconomic History   Marital status: Divorced    Spouse name: Not on file   Number of children: Not on file   Years of education: Not on file   Highest education level: Not on file  Occupational History   Not on file  Tobacco Use   Smoking status: Never   Smokeless tobacco: Never  Vaping Use   Vaping Use: Never used  Substance and Sexual Activity   Alcohol use: No   Drug use: No   Sexual activity: Not Currently    Birth control/protection: Post-menopausal  Other Topics Concern   Not on file  Social History Narrative   Not on file   Social Determinants of Health   Financial Resource Strain: Low Risk  (08/09/2022)   Overall Financial Resource Strain (CARDIA)    Difficulty of Paying Living Expenses: Not hard at all  Food Insecurity: No Food Insecurity (08/09/2022)   Hunger Vital Sign    Worried About Running Out of Food in the Last Year: Never true    Ran Out of Food in the Last Year: Never true  Transportation Needs: No Transportation Needs (08/09/2022)   PRAPARE - Administrator, Civil Service (Medical): No    Lack of Transportation (Non-Medical): No  Physical Activity: Sufficiently Active  (08/09/2022)   Exercise Vital Sign    Days of Exercise per Week: 7 days    Minutes of Exercise per Session: 30 min  Stress: No Stress Concern Present (08/09/2022)   [employer] of Occupational Health - Occupational Stress Questionnaire    Feeling of Stress : Not at all  Social Connections: Socially Isolated (08/09/2022)   Social Connection and Isolation Panel [NHANES]    Frequency of Communication with Friends and Family: More than three times a week    Frequency of Social Gatherings with Friends and Family: More than three times a week    Attends Religious Services: Never    Database administrator or Organizations: No    Attends Engineer, structural: Never    Marital Status: Divorced    Tobacco Counseling Counseling given: Yes   Clinical Intake:  Pre-visit preparation completed: No  Pain : No/denies pain     BMI - recorded: 26.96 Nutritional Risks: None Diabetes: No  How often do you need to have someone help you when you read instructions, pamphlets, or other written materials from your doctor or pharmacy?: 1 - Never What is the last grade level you completed in school?: masters  Interpreter Needed?: No  Information entered by :: Olivia Richards   Activities of Daily Living    08/09/2022    8:37 AM  In your present state of health, do you have any difficulty performing the following activities:  Hearing? 0  Vision? 0  Difficulty concentrating or making decisions? 0  Walking or climbing stairs? 0  Dressing or bathing? 0  Doing errands, shopping? 0  Preparing Food and eating ? N  Using the Toilet? N  In the past six months, have you accidently leaked urine? N  Do you have problems with loss of bowel control? N  Managing your Medications? N  Managing your Finances? N  Housekeeping or managing your Housekeeping? N    Patient Care Team: Duanne Limerick, MD as PCP - General (Family Medicine) Duanne Limerick, MD (Family Medicine)  Indicate any recent  Medical Services you may have received from other than [facility] providers in the past year (date may be approximate).     Assessment:   This is a routine wellness examination for Olivia Richards.  Hearing/Vision screen Hearing Screening - Comments:: Unable to obtain  Dietary issues and exercise activities discussed:     Goals Addressed             This Visit's Progress    Cut out extra servings       Lower cholesterol and move to mountains      Depression Screen    08/09/2022    8:49 AM 03/07/2022    9:03 AM 11/01/2021   10:11 AM  07/06/2020   10:16 AM 06/30/2019    2:15 PM 06/10/2015    8:13 AM 02/19/2015    2:21 PM  PHQ 2/9 Scores  PHQ - 2 Score 0 1 0 6 0 0 1  PHQ- 9 Score  1 0 12 0      Fall Risk    08/09/2022    8:48 AM 03/07/2022    9:03 AM 11/01/2021   10:11 AM 06/30/2019    2:15 PM 06/10/2015    8:13 AM  Fall Risk   Falls in the past year?  0 0 0 No  Number falls in past yr:  0 0    Injury with Fall?  0 0    Risk for fall due to : No Fall Risks No Fall Risks No Fall Risks    Follow up Falls evaluation completed Falls evaluation completed Falls evaluation completed Falls evaluation completed     MEDICARE RISK AT HOME:  Medicare Risk at Home - 08/09/22 0848     Any stairs in or around the home? Yes    If so, are there any without handrails? Yes    Home free of loose throw rugs in walkways, pet beds, electrical cords, etc? No    Adequate lighting in your home to reduce risk of falls? Yes    Life alert? No    Use of a cane, walker or w/c? No    Grab bars in the bathroom? No    Shower chair or bench in shower? No    Elevated toilet seat or a handicapped toilet? No             TIMED UP AND GO:  Was the test performed?  No    Cognitive Function:        08/09/2022    8:49 AM  6CIT Screen  What Year? 0 points  What month? 0 points  What time? 0 points  Count back from 20 0 points  Months in reverse 0 points  Repeat phrase 0 points  Total Score 0 points     Immunizations Immunization History  Administered Date(s) Administered   Influenza,inj,Quad PF,6+ Mos 12/18/2018   Influenza-Unspecified 11/26/2017   Moderna Sars-Covid-2 Vaccination 04/06/2019, 05/04/2019, 01/19/2020   Tdap 02/20/2013    TDAP status: Up to date  Flu Vaccine status: Up to date  Pneumococcal vaccine status: Declined,  Education has been provided regarding the importance of this vaccine but patient still declined. Advised may receive this vaccine at local pharmacy or Health Dept. Aware to provide a copy of the vaccination record if obtained from local pharmacy or Health Dept. Verbalized acceptance and understanding.   Covid-19 vaccine status: Declined, Education has been provided regarding the importance of this vaccine but patient still declined. Advised may receive this vaccine at local pharmacy or Health Dept.or vaccine clinic. Aware to provide a copy of the vaccination record if obtained from local pharmacy or Health Dept. Verbalized acceptance and understanding.  Qualifies for Shingles Vaccine? No   Zostavax completed No   Shingrix Completed?: No.    Education has been provided regarding the importance of this vaccine. Patient has been advised to call insurance company to determine out of pocket expense if they have not yet received this vaccine. Advised may also receive vaccine at local pharmacy or Health Dept. Verbalized acceptance and understanding.  Screening Tests Health Maintenance  Topic Date Due   OPHTHALMOLOGY EXAM  03/23/2021   COVID-19 Vaccine (4 - 2023-24 season) 08/25/2022 (Originally 10/21/2021)  Pneumonia Vaccine 48+ Years old (1 of 1 - PCV) 11/02/2022 (Originally 03/12/2019)   Zoster Vaccines- Shingrix (1 of 2) 11/09/2022 (Originally 03/11/1973)   HEMOGLOBIN A1C  09/05/2022   INFLUENZA VACCINE  09/21/2022   Diabetic kidney evaluation - eGFR measurement  11/02/2022   DTaP/Tdap/Td (2 - Td or Tdap) 02/21/2023   Diabetic kidney evaluation - Urine ACR   03/08/2023   FOOT EXAM  03/08/2023   Medicare Annual Wellness (AWV)  08/09/2023   MAMMOGRAM  03/10/2024   Colonoscopy  06/09/2025   DEXA SCAN  Completed   Hepatitis C Screening  Completed   HPV VACCINES  Aged Out    Health Maintenance  Health Maintenance Due  Topic Date Due   OPHTHALMOLOGY EXAM  03/23/2021    Colorectal cancer screening: Type of screening: Colonoscopy. Completed 06/10/2015. Repeat every 10 years  Mammogram status: Completed 03/10/2022. Repeat every year  Bone Density status: Completed 10/18/20. Results reflect: Bone density results: NORMAL. Repeat every 2 years.  Lung Cancer Screening: (Low Dose CT Chest recommended if Age 59-80 years, 20 pack-year currently smoking OR have quit w/in 15years.) does not qualify.   Lung Cancer Screening Referral: n/a  Additional Screening:  Hepatitis C Screening: does not want  Vision Screening: Recommended annual ophthalmology exams for early detection of glaucoma and other disorders of the eye. Is the patient up to date with their annual eye exam?   pt stated she had it done in 2022 and does not wish to have another just yet. Who is the provider or what is the name of the office in which the patient attends annual eye exams? [facility] If pt is not established with a provider, would they like to be referred to a provider to establish care? No .   Dental Screening: Recommended annual dental exams for proper oral hygiene     Plan:     I have personally reviewed and noted the following in the patient's chart:   Medical and social history Use of alcohol, tobacco or illicit drugs  Current medications and supplements including opioid prescriptions.  Functional ability and status Nutritional status Physical activity Advanced directives List of other physicians Hospitalizations, surgeries, and ER visits in previous 12 months Vitals Screenings to include cognitive, depression, and falls Referrals and appointments  In  addition, I have reviewed and discussed with patient certain preventive protocols, quality metrics, and best practice recommendations. A written personalized care plan for preventive services as well as general preventive health recommendations were provided to patient.     Mariel Sleet, CMA   08/10/2022   After Visit Summary: (MyChart) Due to this being a telephonic visit, the after visit summary with patients personalized plan was offered to patient via MyChart   Nurse Notes: Patient does not have concerns at this time

## 2022-10-27 DIAGNOSIS — D0511 Intraductal carcinoma in situ of right breast: Secondary | ICD-10-CM | POA: Diagnosis not present

## 2022-11-07 ENCOUNTER — Encounter: Payer: Self-pay | Admitting: Family Medicine

## 2022-11-07 ENCOUNTER — Ambulatory Visit (INDEPENDENT_AMBULATORY_CARE_PROVIDER_SITE_OTHER): Payer: Medicare PPO | Admitting: Family Medicine

## 2022-11-07 VITALS — BP 120/68 | HR 68 | Ht 65.0 in | Wt 158.0 lb

## 2022-11-07 DIAGNOSIS — E785 Hyperlipidemia, unspecified: Secondary | ICD-10-CM | POA: Diagnosis not present

## 2022-11-07 DIAGNOSIS — R7303 Prediabetes: Secondary | ICD-10-CM | POA: Diagnosis not present

## 2022-11-07 NOTE — Progress Notes (Signed)
Date:  11/07/2022   Name:  Olivia Richards   DOB:  1955-02-24   MRN:  [id number]   Chief Complaint: Annual Exam (Yearly check up- no pap) and Prediabetes  HPI  Lab Results  Component Value Date   NA 139 11/01/2021   K 4.2 11/01/2021   CO2 23 11/01/2021   GLUCOSE 123 (H) 11/01/2021   BUN 11 11/01/2021   CREATININE 0.96 11/01/2021   CALCIUM 9.5 11/01/2021   EGFR 65 11/01/2021   GFRNONAA >60 04/22/2019   Lab Results  Component Value Date   CHOL 196 03/07/2022   HDL 58 03/07/2022   LDLCALC 118 (H) 03/07/2022   TRIG 110 03/07/2022   CHOLHDL 3.6 10/19/2020   Lab Results  Component Value Date   TSH 1.520 10/19/2020   Lab Results  Component Value Date   HGBA1C 6.3 (H) 03/07/2022   Lab Results  Component Value Date   WBC 9.6 11/01/2021   HGB 14.2 11/01/2021   HCT 43.1 11/01/2021   MCV 78 (L) 11/01/2021   PLT 312 11/01/2021   Lab Results  Component Value Date   ALT 17 11/01/2021   AST 20 11/01/2021   ALKPHOS 111 11/01/2021   BILITOT 0.5 11/01/2021   Lab Results  Component Value Date   VD25OH 20.1 (L) 10/19/2020     Review of Systems  Constitutional: Negative.  Negative for fatigue and unexpected weight change.  HENT:  Negative for trouble swallowing.   Respiratory:  Negative for cough, shortness of breath and wheezing.   Gastrointestinal:  Negative for abdominal pain.  Genitourinary:  Negative for difficulty urinating and vaginal bleeding.  Skin:  Negative for rash.  Neurological:  Negative for dizziness, weakness and headaches.  Hematological:  Negative for adenopathy. Does not bruise/bleed easily.    Patient Active Problem List   Diagnosis Date Noted   Mixed hyperlipidemia 12/05/2021   Controlled diabetes mellitus type 2 with complications (HCC) 12/05/2021   S/P vaginal hysterectomy 05/07/2019   Uterine prolapse 03/18/2019   Dysuria 06/12/2018   Vulvar candidiasis 06/12/2018    Allergies  Allergen Reactions   Levaquin [Levofloxacin In  D5w] Itching and Other (See Comments)    blisters   Onion Other (See Comments)    Diarrhea and vomiting    Past Surgical History:  Procedure Laterality Date   BREAST LUMPECTOMY Right 04/11/2022   CERVIX SURGERY  POLYP   COLONOSCOPY  02/21/2011   Dr Marva Panda- every 5 yrs   CYSTOCELE REPAIR N/A 04/24/2019   Procedure: ANTERIOR REPAIR (CYSTOCELE);  Surgeon: Nadara Mustard, MD;  Location: [facility];  Service: Gynecology;  Laterality: N/A;   FOOT SURGERY Left    lithotripsy     VAGINAL HYSTERECTOMY N/A 04/24/2019   Procedure: HYSTERECTOMY VAGINAL/ BSO;  Surgeon: Nadara Mustard, MD;  Location: [facility];  Service: Gynecology;  Laterality: N/A;    Social History   Tobacco Use   Smoking status: Never   Smokeless tobacco: Never  Vaping Use   Vaping status: Never Used  Substance Use Topics   Alcohol use: No   Drug use: No     Medication list has been reviewed and updated.  Current Meds  Medication Sig   ibuprofen (ADVIL) 600 MG tablet Take 1 tablet (600 mg total) by mouth every 6 (six) hours as needed.       11/07/2022    9:20 AM 03/07/2022    9:04 AM 11/01/2021   10:12 AM 07/06/2020   10:18 AM  GAD  7 : Generalized Anxiety Score  Nervous, Anxious, on Edge 0 2 0 3  Control/stop worrying 0 2 0 3  Worry too much - different things 0 2 0 3  Trouble relaxing 0 0 0 3  Restless 0 0 0 3  Easily annoyed or irritable 0 0 0 3  Afraid - awful might happen 0 1 0 3  Total GAD 7 Score 0 7 0 21  Anxiety Difficulty Not difficult at all Not difficult at all Not difficult at all Not difficult at all       11/07/2022    9:20 AM 08/09/2022    8:49 AM 03/07/2022    9:03 AM  Depression screen PHQ 2/9  Decreased Interest 0 0 0  Down, Depressed, Hopeless 0 0 1  PHQ - 2 Score 0 0 1  Altered sleeping 0  0  Tired, decreased energy 0  0  Change in appetite 0  0  Feeling bad or failure about yourself  0  0  Trouble concentrating 0  0  Moving slowly or fidgety/restless 0  0  Suicidal  thoughts 0  0  PHQ-9 Score 0  1  Difficult doing work/chores Not difficult at all  Not difficult at all    BP Readings from Last 3 Encounters:  11/07/22 120/68  03/07/22 110/80  11/01/21 130/80    Physical Exam Vitals and nursing note reviewed. Exam conducted with a chaperone present.  Constitutional:      General: She is not in acute distress.    Appearance: She is not diaphoretic.  HENT:     Head: Normocephalic and atraumatic.     Right Ear: Tympanic membrane and external ear normal.     Left Ear: Tympanic membrane and external ear normal.     Nose: Nose normal.     Mouth/Throat:     Mouth: Mucous membranes are moist.  Eyes:     General:        Right eye: No discharge.        Left eye: No discharge.     Conjunctiva/sclera: Conjunctivae normal.     Pupils: Pupils are equal, round, and reactive to light.  Neck:     Thyroid: No thyromegaly.     Vascular: No JVD.  Cardiovascular:     Rate and Rhythm: Normal rate and regular rhythm.     Heart sounds: Normal heart sounds. No murmur heard.    No friction rub. No gallop.  Pulmonary:     Effort: Pulmonary effort is normal.     Breath sounds: Normal breath sounds. No wheezing, rhonchi or rales.  Abdominal:     General: Bowel sounds are normal.     Palpations: Abdomen is soft. There is no mass.     Tenderness: There is no abdominal tenderness. There is no guarding.  Musculoskeletal:        General: Normal range of motion.     Cervical back: Normal range of motion and neck supple.  Lymphadenopathy:     Cervical: No cervical adenopathy.  Skin:    General: Skin is warm and dry.  Neurological:     Mental Status: She is alert.     Deep Tendon Reflexes: Reflexes are normal and symmetric.     Wt Readings from Last 3 Encounters:  11/07/22 158 lb (71.7 kg)  08/09/22 162 lb (73.5 kg)  03/07/22 162 lb (73.5 kg)    BP 120/68   Pulse 68   Ht 5\' 5"  (1.651 m)  Wt 158 lb (71.7 kg)   SpO2 96%   BMI 26.29 kg/m   Assessment  and Plan:   1. Dyslipidemia Chronic.  Controlled.  Stable.  This is diet controlled and patient has done and excellent job with her current dietary approach.  We will check an LDL to reaffirm that there is been further reduction and we will continue to approach supplement dietary alone with discontinuance of statin. - Direct LDL  2. Prediabetes Chronic.  Controlled.  Used to be in controlled diabetic range but is doing very well with her diet and I suspect patient will remain in prediabetic A1c range. - HgB A1c      Elizabeth Sauer, MD

## 2022-11-07 NOTE — Patient Instructions (Signed)

## 2022-11-28 DIAGNOSIS — Z08 Encounter for follow-up examination after completed treatment for malignant neoplasm: Secondary | ICD-10-CM | POA: Diagnosis not present

## 2022-11-28 DIAGNOSIS — Z853 Personal history of malignant neoplasm of breast: Secondary | ICD-10-CM | POA: Diagnosis not present

## 2022-11-28 DIAGNOSIS — D0511 Intraductal carcinoma in situ of right breast: Secondary | ICD-10-CM | POA: Diagnosis not present

## 2022-11-28 DIAGNOSIS — R92323 Mammographic fibroglandular density, bilateral breasts: Secondary | ICD-10-CM | POA: Diagnosis not present

## 2023-01-30 DIAGNOSIS — F325 Major depressive disorder, single episode, in full remission: Secondary | ICD-10-CM | POA: Diagnosis not present

## 2023-01-30 DIAGNOSIS — M8580 Other specified disorders of bone density and structure, unspecified site: Secondary | ICD-10-CM | POA: Diagnosis not present

## 2023-01-30 DIAGNOSIS — E785 Hyperlipidemia, unspecified: Secondary | ICD-10-CM | POA: Diagnosis not present

## 2023-01-30 DIAGNOSIS — Z809 Family history of malignant neoplasm, unspecified: Secondary | ICD-10-CM | POA: Diagnosis not present

## 2023-01-30 DIAGNOSIS — I10 Essential (primary) hypertension: Secondary | ICD-10-CM | POA: Diagnosis not present

## 2023-01-30 DIAGNOSIS — R7303 Prediabetes: Secondary | ICD-10-CM | POA: Diagnosis not present

## 2023-01-30 DIAGNOSIS — Z823 Family history of stroke: Secondary | ICD-10-CM | POA: Diagnosis not present

## 2023-01-30 DIAGNOSIS — Z8249 Family history of ischemic heart disease and other diseases of the circulatory system: Secondary | ICD-10-CM | POA: Diagnosis not present

## 2023-03-06 ENCOUNTER — Encounter: Payer: Self-pay | Admitting: Family Medicine

## 2023-03-06 ENCOUNTER — Ambulatory Visit (INDEPENDENT_AMBULATORY_CARE_PROVIDER_SITE_OTHER): Payer: Medicare PPO | Admitting: Family Medicine

## 2023-03-06 ENCOUNTER — Encounter: Payer: Medicare PPO | Admitting: Family Medicine

## 2023-03-06 VITALS — BP 132/90 | HR 108 | Ht 65.0 in | Wt 166.4 lb

## 2023-03-06 DIAGNOSIS — M7541 Impingement syndrome of right shoulder: Secondary | ICD-10-CM

## 2023-03-06 MED ORDER — MELOXICAM 15 MG PO TABS: 15.0000 mg | ORAL_TABLET | Freq: Every day | ORAL | 0 refills | Status: DC

## 2023-03-06 NOTE — Progress Notes (Signed)
 Primary Care / Sports Medicine Office Visit  Patient Information:  Patient ID: Olivia Richards, female DOB: 07/29/1955 Age: 68 y.o. MRN: [id number]   Olivia Richards is a pleasant 68 y.o. female presenting with the following:  Chief Complaint  Patient presents with   Shoulder Pain    Patient can't lift her right shoulder x 2 months. Patient has difficulty with ROM. Reaching backwards is when it is the worst pain. NKI, no therapy , no injections.     Vitals:   03/06/23 1329  BP: (!) 132/90  Pulse: (!) 108  SpO2: 97%   Vitals:   03/06/23 1329  Weight: 166 lb 6.4 oz (75.5 kg)  Height: 5' 5 (1.651 m)   Body mass index is 27.69 kg/m.  No results found.   Independent interpretation of notes and tests performed by another provider:   None  Procedures performed:   None  Pertinent History, Exam, Impression, and Recommendations:   Problem List Items Addressed This Visit     Rotator cuff impingement syndrome of right shoulder - Primary   HPI: Right-hand-dominant patient presenting with acute on chronic right shoulder pain, initial episode roughly 2 years prior while changing clothes with arms overhead, most recently noted over the past 2 months when suddenly reaching back behind her seat in the car.  Denies any ecchymosis, has noted weakness with overhead activities and pain, denies any paresthesias into the upper extremities.  Treatments have involved relative rest and massage therapy..  Examination demonstrates limited forward flexion to 90 degrees due to pain localizing to posterolateral shoulder, abduction 45 degrees, external rotation and internal rotation/extension reduced when compared to contralateral which is full and unrestricted.  RC testing demonstrates 5/5 strength with internal and external rotation, the latter of which localizing pain to the posterolateral shoulder.  Localizing pain to the posterolateral shoulder.  Isolated supraspinatus testing with 4  -/5 strength, maximal pain, positive Neer's, positive Hawkins, speeds and Yergason's are positive with pain localized to the posterolateral right shoulder.  Negative Spurling's testing.  Assessment: Findings are most consistent with supraspinatus related rotator cuff impingement with concern for at least partial tendon injury. We had a lengthy conversation regarding current treatment guidelines and standards as she wishes to proceed in a nonsurgical manner. - Start topical diclofenac 1% (Voltaren gel) 4 times a day x 1 week, if beneficial can continue this at 2-4 times a day as needed - If ineffective, dose meloxicam  once daily with food x 1-2 weeks, then dose daily as needed - Once shoulder symptoms have improved with medications, start rehab exercises and continue until follow-up - Return for follow-up in 8 weeks - Contact us  for any question/concerns between now and then      Relevant Medications   meloxicam  (MOBIC ) 15 MG tablet   I provided a total time of 45 minutes including both face-to-face and non-face-to-face time on 03/06/2023 inclusive of time utilized for medical chart review, information gathering, care coordination with staff, and documentation completion.   Orders & Medications Medications:  Meds ordered this encounter  Medications   meloxicam  (MOBIC ) 15 MG tablet    Sig: Take 1 tablet (15 mg total) by mouth daily. X 1-2 weeks then daily PRN. Take with food.    Dispense:  30 tablet    Refill:  0   No orders of the defined types were placed in this encounter.    Return in about 2 months (around 05/04/2023) for r shoulder f/u.  Selinda JINNY Ku, MD, [facility]   Primary Care Sports Medicine Primary Care and Sports Medicine at [facility]

## 2023-03-06 NOTE — Assessment & Plan Note (Signed)
 HPI: Right-hand-dominant patient presenting with acute on chronic right shoulder pain, initial episode roughly 2 years prior while changing clothes with arms overhead, most recently noted over the past 2 months when suddenly reaching back behind her seat in the car.  Denies any ecchymosis, has noted weakness with overhead activities and pain, denies any paresthesias into the upper extremities.  Treatments have involved relative rest and massage therapy..  Examination demonstrates limited forward flexion to 90 degrees due to pain localizing to posterolateral shoulder, abduction 45 degrees, external rotation and internal rotation/extension reduced when compared to contralateral which is full and unrestricted.  RC testing demonstrates 5/5 strength with internal and external rotation, the latter of which localizing pain to the posterolateral shoulder.  Localizing pain to the posterolateral shoulder.  Isolated supraspinatus testing with 4 -/5 strength, maximal pain, positive Neer's, positive Hawkins, speeds and Yergason's are positive with pain localized to the posterolateral right shoulder.  Negative Spurling's testing.  Assessment: Findings are most consistent with supraspinatus related rotator cuff impingement with concern for at least partial tendon injury. We had a lengthy conversation regarding current treatment guidelines and standards as she wishes to proceed in a nonsurgical manner. - Start topical diclofenac 1% (Voltaren gel) 4 times a day x 1 week, if beneficial can continue this at 2-4 times a day as needed - If ineffective, dose meloxicam  once daily with food x 1-2 weeks, then dose daily as needed - Once shoulder symptoms have improved with medications, start rehab exercises and continue until follow-up - Return for follow-up in 8 weeks - Contact us  for any question/concerns between now and then

## 2023-03-06 NOTE — Progress Notes (Signed)
 Date:  03/06/2023   Name:  Emaleigh Mbaye   DOB:  1955/10/19   MRN:  [id number]   Chief Complaint: Shoulder Pain  HPI    Medication list has been reviewed and updated.  No outpatient medications have been marked as taking for the 03/06/23 encounter (Office Visit) with Clarise Crooks, MD.     Review of Systems  Patient Active Problem List   Diagnosis Date Noted   Mixed hyperlipidemia 12/05/2021   Controlled diabetes mellitus type 2 with complications (HCC) 12/05/2021   S/P vaginal hysterectomy 05/07/2019   Uterine prolapse 03/18/2019   Dysuria 06/12/2018   Vulvar candidiasis 06/12/2018    Allergies  Allergen Reactions   Levofloxacin  In D5w Itching, Other (See Comments) and Rash    blisters   Onion Other (See Comments)    Diarrhea and vomiting    Immunization History  Administered Date(s) Administered   Influenza,inj,Quad PF,6+ Mos 12/18/2018   Influenza-Unspecified 11/26/2017   Moderna Sars-Covid-2 Vaccination 04/06/2019, 05/04/2019, 01/19/2020   Tdap 02/20/2013    Past Surgical History:  Procedure Laterality Date   BREAST LUMPECTOMY Right 04/11/2022   CERVIX SURGERY  POLYP   COLONOSCOPY  02/21/2011   Dr Peg Bouton- every 5 yrs   CYSTOCELE REPAIR N/A 04/24/2019   Procedure: ANTERIOR REPAIR (CYSTOCELE);  Surgeon: Alben Alma, MD;  Location: [facility];  Service: Gynecology;  Laterality: N/A;   FOOT SURGERY Left    lithotripsy     VAGINAL HYSTERECTOMY N/A 04/24/2019   Procedure: HYSTERECTOMY VAGINAL/ BSO;  Surgeon: Alben Alma, MD;  Location: [facility];  Service: Gynecology;  Laterality: N/A;    Social History   Tobacco Use   Smoking status: Never   Smokeless tobacco: Never  Vaping Use   Vaping status: Never Used  Substance Use Topics   Alcohol use: No   Drug use: No    Family History  Problem Relation Age of Onset   Breast cancer Mother 43   COPD Mother    Hyperlipidemia Mother    Heart disease Father         11/07/2022     9:20 AM 03/07/2022    9:04 AM 11/01/2021   10:12 AM 07/06/2020   10:18 AM  GAD 7 : Generalized Anxiety Score  Nervous, Anxious, on Edge 0 2 0 3  Control/stop worrying 0 2 0 3  Worry too much - different things 0 2 0 3  Trouble relaxing 0 0 0 3  Restless 0 0 0 3  Easily annoyed or irritable 0 0 0 3  Afraid - awful might happen 0 1 0 3  Total GAD 7 Score 0 7 0 21  Anxiety Difficulty Not difficult at all Not difficult at all Not difficult at all Not difficult at all       11/07/2022    9:20 AM 08/09/2022    8:49 AM 03/07/2022    9:03 AM  Depression screen PHQ 2/9  Decreased Interest 0 0 0  Down, Depressed, Hopeless 0 0 1  PHQ - 2 Score 0 0 1  Altered sleeping 0  0  Tired, decreased energy 0  0  Change in appetite 0  0  Feeling bad or failure about yourself  0  0  Trouble concentrating 0  0  Moving slowly or fidgety/restless 0  0  Suicidal thoughts 0  0  PHQ-9 Score 0  1  Difficult doing work/chores Not difficult at all  Not difficult at all  BP Readings from Last 3 Encounters:  11/07/22 120/68  03/07/22 110/80  11/01/21 130/80    Wt Readings from Last 3 Encounters:  11/07/22 158 lb (71.7 kg)  08/09/22 162 lb (73.5 kg)  03/07/22 162 lb (73.5 kg)    Ht 5\' 5"  (1.651 m)   BMI 26.29 kg/m   Physical Exam  Recent Labs     Component Value Date/Time   NA 139 11/01/2021 1107   K 4.2 11/01/2021 1107   CL 102 11/01/2021 1107   CO2 23 11/01/2021 1107   GLUCOSE 123 (H) 11/01/2021 1107   GLUCOSE 120 (H) 04/22/2019 0942   BUN 11 11/01/2021 1107   CREATININE 0.96 11/01/2021 1107   CALCIUM  9.5 11/01/2021 1107   PROT 6.9 11/01/2021 1107   ALBUMIN 4.7 11/01/2021 1107   AST 20 11/01/2021 1107   ALT 17 11/01/2021 1107   ALKPHOS 111 11/01/2021 1107   BILITOT 0.5 11/01/2021 1107   GFRNONAA >60 04/22/2019 0942   GFRAA >60 04/22/2019 0942    Lab Results  Component Value Date   WBC 9.6 11/01/2021   HGB 14.2 11/01/2021   HCT 43.1 11/01/2021   MCV 78 (L) 11/01/2021   PLT  312 11/01/2021   Lab Results  Component Value Date   HGBA1C 6.3 (H) 11/07/2022   Lab Results  Component Value Date   CHOL 196 03/07/2022   HDL 58 03/07/2022   LDLCALC 118 (H) 03/07/2022   LDLDIRECT 136 (H) 11/07/2022   TRIG 110 03/07/2022   CHOLHDL 3.6 10/19/2020   Lab Results  Component Value Date   TSH 1.520 10/19/2020     Assessment and Plan:  There are no diagnoses linked to this encounter.   No follow-ups on file.    Cody Das, PA-C, DMSc, Nutritionist [facility] [phone]

## 2023-03-06 NOTE — Patient Instructions (Addendum)
-   Start topical diclofenac 1% (Voltaren gel) 4 times a day x 1 week, if beneficial can continue this at 2-4 times a day as needed - If ineffective, dose meloxicam  once daily with food x 1-2 weeks, then dose daily as needed - Once shoulder symptoms have improved with medications, start rehab exercises and continue until follow-up - Return for follow-up in 8 weeks - Contact us  for any question/concerns between now and then  Link to exercises: https://orthoinfo.aaos.org/globalassets/pdfs/2022-rotator-cuff-and-shoulder-conditioning-program-handout.pdf  Massage therapist can target trapezius, rhomboids, deltoids, upper and outer (lateral) pectoralis/pectoralis minor, lats.  Table with information on anti-inflammatory supplements that may be useful for pain as an adjunct (not replacement to above)

## 2023-03-20 DIAGNOSIS — L2089 Other atopic dermatitis: Secondary | ICD-10-CM | POA: Diagnosis not present

## 2023-04-02 ENCOUNTER — Other Ambulatory Visit: Payer: Self-pay | Admitting: Family Medicine

## 2023-04-02 DIAGNOSIS — M7541 Impingement syndrome of right shoulder: Secondary | ICD-10-CM

## 2023-04-03 NOTE — Telephone Encounter (Signed)
Requested medication (s) are due for refill today: yes  Requested medication (s) are on the active medication list: yes  Last refill:  03/06/23  Future visit scheduled: yes  Notes to clinic:  Unable to refill per protocol due to failed labs, no updated results.Short supply given, should patient continue to take, rounting for review.      Requested Prescriptions  Pending Prescriptions Disp Refills   meloxicam (MOBIC) 15 MG tablet [Pharmacy Med Name: MELOXICAM 15 MG TABLET] 30 tablet 0    Sig: TAKE 1 TABLET (15 MG TOTAL) BY MOUTH DAILY. X 1-2 WEEKS THEN DAILY AS NEEDED. TAKE WITH FOOD.     Analgesics:  COX2 Inhibitors Failed - 04/03/2023  8:49 AM      Failed - Manual Review: Labs are only required if the patient has taken medication for more than 8 weeks.      Failed - HGB in normal range and within 360 days    Hemoglobin  Date Value Ref Range Status  11/01/2021 14.2 11.1 - 15.9 g/dL Final         Failed - Cr in normal range and within 360 days    Creatinine, Ser  Date Value Ref Range Status  11/01/2021 0.96 0.57 - 1.00 mg/dL Final         Failed - HCT in normal range and within 360 days    Hematocrit  Date Value Ref Range Status  11/01/2021 43.1 34.0 - 46.6 % Final         Failed - AST in normal range and within 360 days    AST  Date Value Ref Range Status  11/01/2021 20 0 - 40 IU/L Final         Failed - ALT in normal range and within 360 days    ALT  Date Value Ref Range Status  11/01/2021 17 0 - 32 IU/L Final         Failed - eGFR is 30 or above and within 360 days    GFR calc Af Amer  Date Value Ref Range Status  04/22/2019 >60 >60 mL/min Final   GFR calc non Af Amer  Date Value Ref Range Status  04/22/2019 >60 >60 mL/min Final   eGFR  Date Value Ref Range Status  11/01/2021 65 >59 mL/min/1.73 Final         Passed - Patient is not pregnant      Passed - Valid encounter within last 12 months    Recent Outpatient Visits           4 weeks ago  Rotator cuff impingement syndrome of right shoulder   [place] [facility], Ocie Bob, MD   4 weeks ago    [facility] Phineas Inches, MD   4 months ago Dyslipidemia   [facility] Phineas Inches, MD   1 year ago Controlled type 2 diabetes mellitus with complication, without long-term current use of insulin (HCC)   [place] [facility] Phineas Inches, MD   1 year ago Annual physical exam   [facility] Phineas Inches, MD       Future Appointments             In 1 month Ashley Royalty, Ocie Bob, MD [facility] Medicine at [facility]

## 2023-05-07 ENCOUNTER — Ambulatory Visit: Payer: Self-pay | Admitting: Family Medicine

## 2023-06-04 ENCOUNTER — Telehealth: Payer: Self-pay

## 2023-06-04 NOTE — Telephone Encounter (Signed)
 Please call pt to schedule a appointment.  KP  Copied from CRM [id number]. Topic: Appointments - Scheduling Inquiry for Clinic >> Jun 04, 2023 11:42 AM Ivette P wrote: Reason for CRM: Pt called in to follow up on her provider retiring pt wanted to know which providers would still be in her network. Advised clinic still takes [facility]. Pt wanted to schedule with Barnetta Liberty for CPS which was performed on 11/07/2022. Attempted to scheduled but was not able to. Pt would like someone to follow up [phone]

## 2023-07-24 ENCOUNTER — Telehealth: Payer: Self-pay | Admitting: Family Medicine

## 2023-07-24 NOTE — Telephone Encounter (Signed)
 Copied from CRM [id number]. Topic: Medicare AWV >> Jul 24, 2023  2:44 PM Juliana Ocean wrote: Reason for CRM: LVM 07/25/2023 to schedule AWV. Please schedule Virtual or Telehealth visits ONLY.   Olivia Richards; Care Guide Ambulatory Clinical Support [place] [facility] Direct Dial: [phone]

## 2023-10-03 NOTE — Telephone Encounter (Signed)
 Called and spoke with pt to get her rescheduled from Dr Alisia 9/12 bump. Pt rescheduled and confirmed for 9/26

## 2023-11-08 ENCOUNTER — Ambulatory Visit: Admitting: Student

## 2023-11-08 ENCOUNTER — Encounter: Payer: Self-pay | Admitting: Student

## 2023-11-08 VITALS — BP 136/84 | HR 84 | Ht 65.0 in | Wt 167.0 lb

## 2023-11-08 DIAGNOSIS — Z86000 Personal history of in-situ neoplasm of breast: Secondary | ICD-10-CM | POA: Insufficient documentation

## 2023-11-08 DIAGNOSIS — R7303 Prediabetes: Secondary | ICD-10-CM

## 2023-11-08 DIAGNOSIS — Z0000 Encounter for general adult medical examination without abnormal findings: Secondary | ICD-10-CM | POA: Insufficient documentation

## 2023-11-08 DIAGNOSIS — M810 Age-related osteoporosis without current pathological fracture: Secondary | ICD-10-CM | POA: Diagnosis not present

## 2023-11-08 DIAGNOSIS — E782 Mixed hyperlipidemia: Secondary | ICD-10-CM | POA: Diagnosis not present

## 2023-11-08 NOTE — Assessment & Plan Note (Addendum)
 Lab work ordered today Declined flu shot and repeat DXA  UTD on mammogram Continue diet and exercise [facility] to results, she reports recall 10 years, will request records

## 2023-11-08 NOTE — Assessment & Plan Note (Signed)
 Last mammogram 9/8 was normal. Surveillance with year mammograms, follow up surgical oncology

## 2023-11-08 NOTE — Assessment & Plan Note (Addendum)
 LDL has been elevated. Patient does not want to take medication. Reports significant changes in diet since 2024 after dx of breast cancer. Eats mostly vegetarian diet. Lipid panel today.

## 2023-11-08 NOTE — Assessment & Plan Note (Addendum)
 Not currently on medications. Not currently taking supplementation. Tries to eat foods rich in vitamin D  and calcium . Exercises regularly and does some weight bearing exercises. CMP and vitamin D  today. Declined repeat DEXA.

## 2023-11-08 NOTE — Assessment & Plan Note (Signed)
 Not currently on medication. Denies any symptoms from this.  Lab Results  Component Value Date   HGBA1C 6.3 (H) 11/07/2022   HGBA1C 6.3 (H) 03/07/2022   HGBA1C 6.6 (H) 11/01/2021  A1c today.

## 2023-11-08 NOTE — Progress Notes (Signed)
 Established Patient Office Visit  Subjective   Patient ID: Olivia Richards, female    DOB: [birth year]  Age: 68 y.o. MRN: [id number]  Chief Complaint  Patient presents with   Establish Care    Patient is here today to transfer care from Dr. Joshua to new PCP    Olivia Richards with medical hx listed below presents today for transfer of care. Previously seeing Dr. Joshua for primary care who recently retired. No acute complaints today. Please refer to problem based charting for further details and assessment and plan of current problem and chronic medical conditions.   Patient Active Problem List   Diagnosis Date Noted   Annual physical exam 11/08/2023   Osteoporosis 11/08/2023   History of ductal carcinoma in situ (DCIS) of right breast 11/08/2023   Rotator cuff impingement syndrome of right shoulder 03/06/2023   Mixed hyperlipidemia 12/05/2021   Prediabetes 12/05/2021   S/P vaginal hysterectomy 05/07/2019      ROS Refer to HPI    Objective:     Outpatient Encounter Medications as of 11/08/2023  Medication Sig   [DISCONTINUED] meloxicam  (MOBIC ) 15 MG tablet TAKE 1 TABLET (15 MG TOTAL) BY MOUTH DAILY. X 1-2 WEEKS THEN DAILY AS NEEDED. TAKE WITH FOOD. (Patient not taking: Reported on 11/08/2023)   No facility-administered encounter medications on file as of 11/08/2023.    BP 136/84   Pulse 84   Ht 5' 5 (1.651 m)   Wt 167 lb (75.8 kg)   SpO2 99%   BMI 27.79 kg/m  BP Readings from Last 3 Encounters:  11/08/23 136/84  03/06/23 (!) 132/90  11/07/22 120/68    Physical Exam Constitutional:      Appearance: Normal appearance.  HENT:     Head: Normocephalic and atraumatic.     Right Ear: Tympanic membrane normal.     Left Ear: Tympanic membrane normal.     Mouth/Throat:     Mouth: Mucous membranes are moist.     Pharynx: Oropharynx is clear.  Eyes:     Extraocular Movements: Extraocular movements intact.     Conjunctiva/sclera: Conjunctivae normal.      Pupils: Pupils are equal, round, and reactive to light.  Cardiovascular:     Rate and Rhythm: Normal rate and regular rhythm.  Pulmonary:     Effort: Pulmonary effort is normal.     Breath sounds: No rhonchi or rales.  Abdominal:     General: Abdomen is flat. Bowel sounds are normal. There is no distension.     Palpations: Abdomen is soft.     Tenderness: There is no abdominal tenderness.  Musculoskeletal:        General: Normal range of motion.     Right lower leg: No edema.     Left lower leg: No edema.  Skin:    General: Skin is warm and dry.     Capillary Refill: Capillary refill takes less than 2 seconds.  Neurological:     General: No focal deficit present.     Mental Status: She is alert and oriented to person, place, and time.  Psychiatric:        Mood and Affect: Mood normal.        Behavior: Behavior normal.        11/08/2023    9:34 AM 11/07/2022    9:20 AM 08/09/2022    8:49 AM  Depression screen PHQ 2/9  Decreased Interest 0 0 0  Down, Depressed, Hopeless 0 0 0  PHQ -  2 Score 0 0 0  Altered sleeping 0 0   Tired, decreased energy 0 0   Change in appetite 0 0   Feeling bad or failure about yourself  0 0   Trouble concentrating 0 0   Moving slowly or fidgety/restless 0 0   Suicidal thoughts 0 0   PHQ-9 Score 0 0   Difficult doing work/chores Not difficult at all Not difficult at all        11/08/2023    9:34 AM 11/07/2022    9:20 AM 03/07/2022    9:04 AM 11/01/2021   10:12 AM  GAD 7 : Generalized Anxiety Score  Nervous, Anxious, on Edge 1 0 2 0  Control/stop worrying 0 0 2 0  Worry too much - different things 1 0 2 0  Trouble relaxing 0 0 0 0  Restless 0 0 0 0  Easily annoyed or irritable 0 0 0 0  Afraid - awful might happen 0 0 1 0  Total GAD 7 Score 2 0 7 0  Anxiety Difficulty Not difficult at all Not difficult at all Not difficult at all Not difficult at all    No results found for any visits on 11/08/23.  Last CBC Lab Results  Component Value  Date   WBC 9.6 11/01/2021   HGB 14.2 11/01/2021   HCT 43.1 11/01/2021   MCV 78 (L) 11/01/2021   MCH 25.8 (L) 11/01/2021   RDW 13.5 11/01/2021   PLT 312 11/01/2021   Last metabolic panel Lab Results  Component Value Date   GLUCOSE 123 (H) 11/01/2021   NA 139 11/01/2021   K 4.2 11/01/2021   CL 102 11/01/2021   CO2 23 11/01/2021   BUN 11 11/01/2021   CREATININE 0.96 11/01/2021   EGFR 65 11/01/2021   CALCIUM  9.5 11/01/2021   PROT 6.9 11/01/2021   ALBUMIN 4.7 11/01/2021   LABGLOB 2.2 11/01/2021   AGRATIO 2.1 11/01/2021   BILITOT 0.5 11/01/2021   ALKPHOS 111 11/01/2021   AST 20 11/01/2021   ALT 17 11/01/2021   ANIONGAP 9 04/22/2019   Last lipids Lab Results  Component Value Date   CHOL 196 03/07/2022   HDL 58 03/07/2022   LDLCALC 118 (H) 03/07/2022   LDLDIRECT 136 (H) 11/07/2022   TRIG 110 03/07/2022   CHOLHDL 3.6 10/19/2020   Last hemoglobin A1c Lab Results  Component Value Date   HGBA1C 6.3 (H) 11/07/2022      The 10-year ASCVD risk score (Arnett DK, et al., 2019) is: 9.4%    Assessment & Plan:  Mixed hyperlipidemia Assessment & Plan: LDL has been elevated. Patient does not want to take medication. Reports significant changes in diet since 2024 after dx of breast cancer. Eats mostly vegetarian diet. Lipid panel today.   Orders: -     Hemoglobin A1c -     Lipid panel -     Basic metabolic panel with GFR -     CBC with Differential/Platelet  Prediabetes Assessment & Plan: Not currently on medication. Denies any symptoms from this.  Lab Results  Component Value Date   HGBA1C 6.3 (H) 11/07/2022   HGBA1C 6.3 (H) 03/07/2022   HGBA1C 6.6 (H) 11/01/2021  A1c today.    Age-related osteoporosis without current pathological fracture Assessment & Plan: Not currently on medications. Not currently taking supplementation. Tries to eat foods rich in vitamin D  and calcium . Exercises regularly and does some weight bearing exercises. CMP and vitamin D  today.  Declined repeat DEXA.  Orders: -     VITAMIN D  25 Hydroxy (Vit-D Deficiency, Fractures)  Annual physical exam Assessment & Plan: Lab work ordered today Declined flu shot and repeat DXA  UTD on mammogram Continue diet and exercise [facility] 2017 [facility] unable to results, she reports recall 10 years, will request records    History of ductal carcinoma in situ (DCIS) of right breast Assessment & Plan: Last mammogram 9/8 was normal. Surveillance with year mammograms, follow up surgical oncology      Return in about 1 year (around 11/07/2024) for physical.    Harlene Saddler, MD

## 2023-11-09 ENCOUNTER — Telehealth: Payer: Self-pay

## 2023-11-09 ENCOUNTER — Ambulatory Visit: Payer: Self-pay | Admitting: Student

## 2023-11-09 LAB — BASIC METABOLIC PANEL WITH GFR
BUN/Creatinine Ratio: 16 (ref 12–28)
BUN: 13 mg/dL (ref 8–27)
CO2: 21 mmol/L (ref 20–29)
Calcium: 9.1 mg/dL (ref 8.7–10.3)
Chloride: 103 mmol/L (ref 96–106)
Creatinine, Ser: 0.83 mg/dL (ref 0.57–1.00)
Glucose: 113 mg/dL — ABNORMAL HIGH (ref 70–99)
Potassium: 4.3 mmol/L (ref 3.5–5.2)
Sodium: 139 mmol/L (ref 134–144)
eGFR: 76 mL/min/1.73 (ref >59)

## 2023-11-09 LAB — CBC WITH DIFFERENTIAL/PLATELET
Basophils Absolute: 0.1 x10E3/uL (ref 0.0–0.2)
Basos: 1 %
EOS (ABSOLUTE): 0.4 x10E3/uL (ref 0.0–0.4)
Eos: 5 %
Hematocrit: 42.6 % (ref 34.0–46.6)
Hemoglobin: 14 g/dL (ref 11.1–15.9)
Immature Grans (Abs): 0 x10E3/uL (ref 0.0–0.1)
Immature Granulocytes: 0 %
Lymphocytes Absolute: 2 x10E3/uL (ref 0.7–3.1)
Lymphs: 26 %
MCH: 26.5 pg — ABNORMAL LOW (ref 26.6–33.0)
MCHC: 32.9 g/dL (ref 31.5–35.7)
MCV: 81 fL (ref 79–97)
Monocytes Absolute: 0.6 x10E3/uL (ref 0.1–0.9)
Monocytes: 7 %
Neutrophils Absolute: 4.9 x10E3/uL (ref 1.4–7.0)
Neutrophils: 61 %
Platelets: 275 x10E3/uL (ref 150–450)
RBC: 5.28 x10E6/uL (ref 3.77–5.28)
RDW: 14.1 % (ref 11.7–15.4)
WBC: 7.9 x10E3/uL (ref 3.4–10.8)

## 2023-11-09 LAB — HEMOGLOBIN A1C
Est. average glucose Bld gHb Est-mCnc: 134 mg/dL
Hgb A1c MFr Bld: 6.3 % — ABNORMAL HIGH (ref 4.8–5.6)

## 2023-11-09 LAB — LIPID PANEL
Chol/HDL Ratio: 3.4 ratio (ref 0.0–4.4)
Cholesterol, Total: 208 mg/dL — ABNORMAL HIGH (ref 100–199)
HDL: 62 mg/dL (ref >39)
LDL Chol Calc (NIH): 132 mg/dL — ABNORMAL HIGH (ref 0–99)
Triglycerides: 76 mg/dL (ref 0–149)
VLDL Cholesterol Cal: 14 mg/dL (ref 5–40)

## 2023-11-09 LAB — VITAMIN D 25 HYDROXY (VIT D DEFICIENCY, FRACTURES): Vit D, 25-Hydroxy: 18.4 ng/mL — ABNORMAL LOW (ref 30.0–100.0)

## 2023-11-09 NOTE — Telephone Encounter (Signed)
 Copied from CRM [id number]. Topic: Clinical - Lab/Test Results >> Nov 08, 2023  3:55 PM Montie POUR wrote: Reason for CRM:  Olivia Richards is calling to have Dr. Lemon to message her with her lab results on MyChart after she reviews them.

## 2023-11-09 NOTE — Telephone Encounter (Signed)
 Pt viewed her labs on Mychart.  KP

## 2023-11-12 NOTE — Telephone Encounter (Signed)
 Please review patient's response.

## 2023-11-25 ENCOUNTER — Ambulatory Visit
Admission: EM | Admit: 2023-11-25 | Discharge: 2023-11-25 | Disposition: A | Discharge status: Home / Self Care | Attending: Student | Admitting: Student

## 2023-11-25 ENCOUNTER — Encounter: Payer: Self-pay | Admitting: Emergency Medicine

## 2023-11-25 DIAGNOSIS — B349 Viral infection, unspecified: Secondary | ICD-10-CM | POA: Diagnosis present

## 2023-11-25 LAB — RESP PANEL BY RT-PCR (FLU A&B, COVID) ARPGX2
Influenza A by PCR: NEGATIVE
Influenza B by PCR: NEGATIVE
SARS Coronavirus 2 by RT PCR: NEGATIVE

## 2023-11-25 NOTE — ED Triage Notes (Signed)
 Patient [place]  Patient states that during the night she had bodyaches, diarrhea and headache.  Patient denies cough.  Patient denies fevers.

## 2023-11-25 NOTE — Discharge Instructions (Signed)
-  You can purchase Imodium (loperamide) over-the-counter. Take the Imodium (loperamide) up to 4 times daily for diarrhea.

## 2023-11-25 NOTE — ED Provider Notes (Signed)
 [facility]    CSN: [id number] Arrival date & time: 11/25/23  1319      History   Chief Complaint Chief Complaint  Patient presents with   Generalized Body Aches   Headache   Diarrhea    HPI Marlenne Ridge is a 68 y.o. female presenting with viral syndrome. Symptoms x12 hours. H/o degenerative disc disease. Describes myalgias, diarrhea, HA.  Endorses diarrhea, but denies abd pain, nausea, or vomiting. Endorses decreased appetite, but is tolerating food and fluids. Endorses myalgias. Denies fevers. Has not attempted any medications for her symptoms.  Denies h/o pulm ds.  HPI  Past Medical History:  Diagnosis Date   DDD (degenerative disc disease), cervical    Family history of breast cancer    H/O colonoscopy 2017   History of kidney stones    Low blood pressure     Patient Active Problem List   Diagnosis Date Noted   Annual physical exam 11/08/2023   Osteoporosis 11/08/2023   History of ductal carcinoma in situ (DCIS) of right breast 11/08/2023   Rotator cuff impingement syndrome of right shoulder 03/06/2023   Mixed hyperlipidemia 12/05/2021   Prediabetes 12/05/2021   S/P vaginal hysterectomy 05/07/2019    Past Surgical History:  Procedure Laterality Date   BREAST LUMPECTOMY Right 04/11/2022   CERVIX SURGERY  POLYP   COLONOSCOPY  02/21/2011   Dr Gaylyn- every 5 yrs   CYSTOCELE REPAIR N/A 04/24/2019   Procedure: ANTERIOR REPAIR (CYSTOCELE);  Surgeon: Arloa Lamar SQUIBB, MD;  Location: [facility];  Service: Gynecology;  Laterality: N/A;   FOOT SURGERY Left    lithotripsy     VAGINAL HYSTERECTOMY N/A 04/24/2019   Procedure: HYSTERECTOMY VAGINAL/ BSO;  Surgeon: Arloa Lamar SQUIBB, MD;  Location: [facility];  Service: Gynecology;  Laterality: N/A;    OB History     Gravida  2   Para  2   Term  2   Preterm      AB      Living  2      SAB      IAB      Ectopic      Multiple      Live Births  2            Home Medications     Prior to Admission medications   Not on File    Family History Family History  Problem Relation Age of Onset   Cancer Mother        breast cancer   Breast cancer Mother 79   COPD Mother    Hyperlipidemia Mother    Heart disease Father     Social History Social History   Tobacco Use   Smoking status: Never   Smokeless tobacco: Never  Vaping Use   Vaping status: Never Used  Substance Use Topics   Alcohol use: No   Drug use: No     Allergies   Levofloxacin  in d5w and Onion   Review of Systems Review of Systems  Constitutional:  Negative for appetite change, chills and fever.  HENT:  Negative for congestion, ear pain, rhinorrhea, sinus pressure, sinus pain and sore throat.   Eyes:  Negative for redness and visual disturbance.  Respiratory:  Negative for cough, chest tightness, shortness of breath and wheezing.   Cardiovascular:  Negative for chest pain and palpitations.  Gastrointestinal:  Positive for diarrhea. Negative for abdominal pain, constipation, nausea and vomiting.  Genitourinary:  Negative for dysuria, frequency and urgency.  Musculoskeletal:  Negative for myalgias.  Neurological:  Negative for dizziness, weakness and headaches.  Psychiatric/Behavioral:  Negative for confusion.   All other systems reviewed and are negative.    Physical Exam Triage Vital Signs ED Triage Vitals  Encounter Vitals Group     BP 11/25/23 1342 (!) 141/92     Girls Systolic BP Percentile --      Girls Diastolic BP Percentile --      Boys Systolic BP Percentile --      Boys Diastolic BP Percentile --      Pulse Rate 11/25/23 1342 (!) 101     Resp 11/25/23 1342 14     Temp 11/25/23 1342 99.3 F (37.4 C)     Temp Source 11/25/23 1342 Oral     SpO2 11/25/23 1342 96 %     Weight 11/25/23 1341 167 lb 1.7 oz (75.8 kg)     Height 11/25/23 1341 5' 5 (1.651 m)     Head Circumference --      Peak Flow --      Pain Score 11/25/23 1341 3     Pain Loc --      Pain Education  --      Exclude from Growth Chart --    No data found.  Updated Vital Signs BP (!) 141/92 (BP Location: Right Arm)   Pulse (!) 101   Temp 99.3 F (37.4 C) (Oral)   Resp 14   Ht 5' 5 (1.651 m)   Wt 167 lb 1.7 oz (75.8 kg)   SpO2 96%   BMI 27.81 kg/m   Visual Acuity Right Eye Distance:   Left Eye Distance:   Bilateral Distance:    Right Eye Near:   Left Eye Near:    Bilateral Near:     Physical Exam Vitals reviewed.  Constitutional:      General: She is not in acute distress.    Appearance: Normal appearance. She is not ill-appearing.  HENT:     Head: Normocephalic and atraumatic.     Right Ear: Tympanic membrane, ear canal and external ear normal. No tenderness. No middle ear effusion. There is no impacted cerumen. Tympanic membrane is not perforated, erythematous, retracted or bulging.     Left Ear: Tympanic membrane, ear canal and external ear normal. No tenderness.  No middle ear effusion. There is no impacted cerumen. Tympanic membrane is not perforated, erythematous, retracted or bulging.     Nose: Nose normal. No congestion.     Mouth/Throat:     Mouth: Mucous membranes are moist.     Pharynx: Uvula midline. No oropharyngeal exudate or posterior oropharyngeal erythema.  Eyes:     Extraocular Movements: Extraocular movements intact.     Pupils: Pupils are equal, round, and reactive to light.  Cardiovascular:     Rate and Rhythm: Normal rate and regular rhythm.     Heart sounds: Normal heart sounds.  Pulmonary:     Effort: Pulmonary effort is normal.     Breath sounds: Normal breath sounds. No decreased breath sounds, wheezing, rhonchi or rales.  Abdominal:     Palpations: Abdomen is soft.     Tenderness: There is no abdominal tenderness. There is no guarding or rebound. Negative signs include Murphy's sign, Rovsing's sign and McBurney's sign.     Comments: There is no tenderness to palpation.  There is no guarding or rebound.  Lymphadenopathy:     Cervical:  No cervical adenopathy.     Right cervical:  No superficial cervical adenopathy.    Left cervical: No superficial cervical adenopathy.  Neurological:     General: No focal deficit present.     Mental Status: She is alert and oriented to person, place, and time.  Psychiatric:        Mood and Affect: Mood normal.        Behavior: Behavior normal.        Thought Content: Thought content normal.        Judgment: Judgment normal.      UC Treatments / Results  Labs (all labs ordered are listed, but only abnormal results are displayed) Labs Reviewed  RESP PANEL BY RT-PCR (FLU A&B, COVID) [id number]    EKG   Radiology No results found.  Procedures Procedures (including critical care time)  Medications Ordered in UC Medications - No data to display  Initial Impression / Assessment and Plan / UC Course  I have reviewed the triage vital signs and the nursing notes.  Pertinent labs & imaging results that were available during my care of the patient were reviewed by me and considered in my medical decision making (see chart for details).     This patient is a 68 year old female presenting with 12 hours of a viral syndrome.  COVID PCR negative. Influenza PCR negative.  Will manage symptomatically with Imodium, BRAT diet, good hydration. Pt declines Zofran : she is not nauseous, and is tolerating fluids PO without issue.     Final Clinical Impressions(s) / UC Diagnoses   Final diagnoses:  Viral syndrome     Discharge Instructions      -You can purchase Imodium (loperamide) over-the-counter. Take the Imodium (loperamide) up to 4 times daily for diarrhea.      ED Prescriptions   None    PDMP not reviewed this encounter.   Arlyss Leita BRAVO, PA-C 11/25/23 1452

## 2023-11-29 ENCOUNTER — Encounter: Payer: Self-pay | Admitting: Student

## 2023-11-29 ENCOUNTER — Other Ambulatory Visit (HOSPITAL_COMMUNITY)
Admission: RE | Admit: 2023-11-29 | Discharge: 2023-11-29 | Disposition: A | Discharge status: Home / Self Care | Source: Ambulatory Visit | Attending: Student | Admitting: Student

## 2023-11-29 ENCOUNTER — Ambulatory Visit: Admitting: Student

## 2023-11-29 VITALS — BP 132/78 | HR 90 | Temp 98.1°F | Ht 65.0 in | Wt 166.4 lb

## 2023-11-29 DIAGNOSIS — N898 Other specified noninflammatory disorders of vagina: Secondary | ICD-10-CM | POA: Insufficient documentation

## 2023-11-29 LAB — POCT URINALYSIS DIPSTICK
Bilirubin, UA: NEGATIVE
Glucose, UA: NEGATIVE
Ketones, UA: NEGATIVE
Nitrite, UA: NEGATIVE
Protein, UA: NEGATIVE
Spec Grav, UA: 1.01 (ref 1.010–1.025)
Urobilinogen, UA: 0.2 U/dL
pH, UA: 6.5 (ref 5.0–8.0)

## 2023-11-29 NOTE — Progress Notes (Signed)
 Established Patient Office Visit  Subjective   Patient ID: Olivia Richards, female    DOB: 04-08-1955  Age: 68 y.o. MRN: [id number]  Chief Complaint  Patient presents with   Vaginal Itching    [name] with medical hx listed below presents today for vaginal dryness for the past 1 week with associated itching. Symptoms has gradually improved but worsened today. Notes small amount of blood after wiping after obtaining vaginal self swab today. She denies fever, chills, vaginal discharge, bleeding, or hematuria.   Patient Active Problem List   Diagnosis Date Noted   Vaginal dryness 12/06/2023   Annual physical exam 11/08/2023   Osteoporosis 11/08/2023   History of ductal carcinoma in situ (DCIS) of right breast 11/08/2023   Rotator cuff impingement syndrome of right shoulder 03/06/2023   Mixed hyperlipidemia 12/05/2021   Prediabetes 12/05/2021   S/P vaginal hysterectomy 05/07/2019      ROS Refer to HPI    Objective:     No outpatient encounter medications on file as of 11/29/2023.   No facility-administered encounter medications on file as of 11/29/2023.    BP 132/78   Pulse 90   Temp 98.1 F (36.7 C) (Oral)   Ht 5' 5 (1.651 m)   Wt 166 lb 6 oz (75.5 kg)   SpO2 96%   BMI 27.69 kg/m  BP Readings from Last 3 Encounters:  11/29/23 132/78  11/25/23 (!) 141/92  11/08/23 136/84    Physical Exam Constitutional:      Appearance: Normal appearance.  HENT:     Mouth/Throat:     Mouth: Mucous membranes are moist.     Pharynx: Oropharynx is clear.  Cardiovascular:     Rate and Rhythm: Normal rate and regular rhythm.  Pulmonary:     Effort: Pulmonary effort is normal.     Breath sounds: No rhonchi or rales.  Abdominal:     General: Abdomen is flat. Bowel sounds are normal. There is no distension.     Palpations: Abdomen is soft.     Tenderness: There is no abdominal tenderness.  Musculoskeletal:        General: Normal range of motion.     Right  lower leg: No edema.     Left lower leg: No edema.  Skin:    General: Skin is warm and dry.     Capillary Refill: Capillary refill takes less than 2 seconds.  Neurological:     General: No focal deficit present.     Mental Status: She is alert and oriented to person, place, and time.  Psychiatric:        Mood and Affect: Mood normal.        Behavior: Behavior normal.        11/29/2023   11:14 AM 11/08/2023    9:34 AM 11/07/2022    9:20 AM  Depression screen PHQ 2/9  Decreased Interest 0 0 0  Down, Depressed, Hopeless 0 0 0  PHQ - 2 Score 0 0 0  Altered sleeping  0 0  Tired, decreased energy  0 0  Change in appetite  0 0  Feeling bad or failure about yourself   0 0  Trouble concentrating  0 0  Moving slowly or fidgety/restless  0 0  Suicidal thoughts  0 0  PHQ-9 Score  0 0  Difficult doing work/chores  Not difficult at all Not difficult at all       11/29/2023   11:14 AM 11/08/2023  9:34 AM 11/07/2022    9:20 AM 03/07/2022    9:04 AM  GAD 7 : Generalized Anxiety Score  Nervous, Anxious, on Edge 0 1 0 2  Control/stop worrying 0 0 0 2  Worry too much - different things  1 0 2  Trouble relaxing  0 0 0  Restless  0 0 0  Easily annoyed or irritable  0 0 0  Afraid - awful might happen  0 0 1  Total GAD 7 Score  2 0 7  Anxiety Difficulty  Not difficult at all Not difficult at all Not difficult at all    Results for orders placed or performed in visit on 11/29/23  Urine Culture   Specimen: Urine   Urine  Result Value Ref Range   Urine Culture, Routine Final report    Organism ID, Bacteria Comment   Microscopic Examination   Urine  Result Value Ref Range   WBC, UA None seen 0 - 5 /hpf   RBC, Urine None seen 0 - 2 /hpf   Epithelial Cells (non renal) None seen 0 - 10 /hpf   Casts None seen None seen /lpf   Bacteria, UA None seen None seen/Few  Urinalysis, Routine w reflex microscopic  Result Value Ref Range   Specific Gravity, UA 1.008 1.005 - 1.030   pH, UA 6.5  5.0 - 7.5   Color, UA Yellow Yellow   Appearance Ur Clear Clear   Leukocytes,UA Trace (A) Negative   Protein,UA Negative Negative/Trace   Glucose, UA Negative Negative   Ketones, UA Negative Negative   RBC, UA Trace (A) Negative   Bilirubin, UA Negative Negative   Urobilinogen, Ur 0.2 0.2 - 1.0 mg/dL   Nitrite, UA Negative Negative   Microscopic Examination See below:   HIV antibody (with reflex)  Result Value Ref Range   HIV Screen 4th Generation wRfx CANCELED   POCT Urinalysis Dipstick  Result Value Ref Range   Color, UA yellow    Clarity, UA cloudy    Glucose, UA Negative Negative   Bilirubin, UA negative    Ketones, UA negative    Spec Grav, UA 1.010 1.010 - 1.025   Blood, UA small    pH, UA 6.5 5.0 - 8.0   Protein, UA Negative Negative   Urobilinogen, UA 0.2 0.2 or 1.0 E.U./dL   Nitrite, UA negative    Leukocytes, UA Small (1+) (A) Negative   Appearance     Odor    Cervicovaginal ancillary only  Result Value Ref Range   Neisseria Gonorrhea Negative    Chlamydia Negative    Trichomonas Negative    Bacterial Vaginitis (gardnerella) Negative    Candida Vaginitis Negative    Candida Glabrata Negative    Comment      Normal Reference Range Bacterial Vaginosis - Negative   Comment Normal Reference Range Candida Species - Negative    Comment Normal Reference Range Candida Galbrata - Negative    Comment Normal Reference Range Trichomonas - Negative    Comment Normal Reference Ranger Chlamydia - Negative    Comment      Normal Reference Range Neisseria Gonorrhea - Negative    Last CBC Lab Results  Component Value Date   WBC 7.9 11/08/2023   HGB 14.0 11/08/2023   HCT 42.6 11/08/2023   MCV 81 11/08/2023   MCH 26.5 (L) 11/08/2023   RDW 14.1 11/08/2023   PLT 275 11/08/2023   Last metabolic panel Lab Results  Component Value Date  GLUCOSE 113 (H) 11/08/2023   NA 139 11/08/2023   K 4.3 11/08/2023   CL 103 11/08/2023   CO2 21 11/08/2023   BUN 13 11/08/2023    CREATININE 0.83 11/08/2023   EGFR 76 11/08/2023   CALCIUM  9.1 11/08/2023   PROT 6.9 11/01/2021   ALBUMIN 4.7 11/01/2021   LABGLOB 2.2 11/01/2021   AGRATIO 2.1 11/01/2021   BILITOT 0.5 11/01/2021   ALKPHOS 111 11/01/2021   AST 20 11/01/2021   ALT 17 11/01/2021   ANIONGAP 9 04/22/2019   Last lipids Lab Results  Component Value Date   CHOL 208 (H) 11/08/2023   HDL 62 11/08/2023   LDLCALC 132 (H) 11/08/2023   LDLDIRECT 136 (H) 11/07/2022   TRIG 76 11/08/2023   CHOLHDL 3.4 11/08/2023   Last hemoglobin A1c Lab Results  Component Value Date   HGBA1C 6.3 (H) 11/08/2023      The 10-year ASCVD risk score (Arnett DK, et al., 2019) is: 8.9%    Assessment & Plan:  Vaginal dryness Assessment & Plan: Increased vaginal dryness with red tinged urine today. No gross hematuria prior to today. Suspect due to mild amount of blood after obtaining a vaginal swab today. Suspect vaginal infection vs post menopausal genital urinary syndrome -Check for vaginal swab for G/C, BV, trich, candida, if negative can try vaginal estrogen -UA and culture  Orders: -     Urine Culture -     Urinalysis, Routine w reflex microscopic -     Cervicovaginal ancillary only -     HIV Antibody (routine testing w rflx) -     POCT urinalysis dipstick  Other orders -     Microscopic Examination     No follow-ups on file.    Harlene Saddler, MD

## 2023-11-30 LAB — MICROSCOPIC EXAMINATION
Bacteria, UA: NONE SEEN
Casts: NONE SEEN /LPF
Epithelial Cells (non renal): NONE SEEN /HPF (ref 0–10)
RBC, Urine: NONE SEEN /HPF (ref 0–2)
WBC, UA: NONE SEEN /HPF (ref 0–5)

## 2023-11-30 LAB — URINALYSIS, ROUTINE W REFLEX MICROSCOPIC
Bilirubin, UA: NEGATIVE
Glucose, UA: NEGATIVE
Ketones, UA: NEGATIVE
Nitrite, UA: NEGATIVE
Protein,UA: NEGATIVE
Specific Gravity, UA: 1.008 (ref 1.005–1.030)
Urobilinogen, Ur: 0.2 mg/dL (ref 0.2–1.0)
pH, UA: 6.5 (ref 5.0–7.5)

## 2023-11-30 LAB — HIV ANTIBODY (ROUTINE TESTING W REFLEX)

## 2023-12-01 LAB — URINE CULTURE

## 2023-12-03 ENCOUNTER — Ambulatory Visit: Payer: Self-pay | Admitting: Student

## 2023-12-03 LAB — CERVICOVAGINAL ANCILLARY ONLY
Bacterial Vaginitis (gardnerella): NEGATIVE
Candida Glabrata: NEGATIVE
Candida Vaginitis: NEGATIVE
Chlamydia: NEGATIVE
Comment: NEGATIVE
Comment: NEGATIVE
Comment: NEGATIVE
Comment: NEGATIVE
Comment: NEGATIVE
Comment: NORMAL
Neisseria Gonorrhea: NEGATIVE
Trichomonas: NEGATIVE

## 2023-12-06 DIAGNOSIS — N898 Other specified noninflammatory disorders of vagina: Secondary | ICD-10-CM | POA: Insufficient documentation

## 2023-12-06 DIAGNOSIS — E118 Type 2 diabetes mellitus with unspecified complications: Secondary | ICD-10-CM | POA: Insufficient documentation

## 2023-12-06 NOTE — Assessment & Plan Note (Signed)
 Increased vaginal dryness with red tinged urine today. No gross hematuria prior to today. Suspect due to mild amount of blood after obtaining a vaginal swab today. Suspect vaginal infection vs post menopausal genital urinary syndrome -Check for vaginal swab for G/C, BV, trich, candida, if negative can try vaginal estrogen -UA and culture

## 2024-08-06 ENCOUNTER — Ambulatory Visit

## 2024-11-10 ENCOUNTER — Encounter: Admitting: Student
